# Patient Record
Sex: Male | Born: 1965 | Race: White | Hispanic: No | Marital: Married | Smoking: Never smoker
Health system: Southern US, Community
[De-identification: ages and names within clinical notes are randomized; demographics above are authoritative.]

## PROBLEM LIST (undated history)

## (undated) DIAGNOSIS — M1990 Unspecified osteoarthritis, unspecified site: Secondary | ICD-10-CM

## (undated) DIAGNOSIS — T7840XA Allergy, unspecified, initial encounter: Secondary | ICD-10-CM

## (undated) DIAGNOSIS — G4730 Sleep apnea, unspecified: Secondary | ICD-10-CM

## (undated) DIAGNOSIS — K5190 Ulcerative colitis, unspecified, without complications: Secondary | ICD-10-CM

## (undated) DIAGNOSIS — J45909 Unspecified asthma, uncomplicated: Secondary | ICD-10-CM

## (undated) HISTORY — PX: NASAL SEPTOPLASTY W/ TURBINOPLASTY: SHX2070

## (undated) HISTORY — DX: Ulcerative colitis, unspecified, without complications: K51.90

## (undated) HISTORY — DX: Allergy, unspecified, initial encounter: T78.40XA

## (undated) HISTORY — DX: Unspecified asthma, uncomplicated: J45.909

## (undated) HISTORY — DX: Unspecified osteoarthritis, unspecified site: M19.90

## (undated) HISTORY — DX: Sleep apnea, unspecified: G47.30

---

## 2003-09-24 ENCOUNTER — Emergency Department (HOSPITAL_COMMUNITY): Admission: EM | Admit: 2003-09-24 | Discharge: 2003-09-24 | Payer: Self-pay | Admitting: Emergency Medicine

## 2005-05-12 ENCOUNTER — Ambulatory Visit (HOSPITAL_COMMUNITY): Admission: RE | Admit: 2005-05-12 | Discharge: 2005-05-12 | Payer: Self-pay | Admitting: Family Medicine

## 2007-01-17 DIAGNOSIS — K5190 Ulcerative colitis, unspecified, without complications: Secondary | ICD-10-CM

## 2007-01-17 HISTORY — DX: Ulcerative colitis, unspecified, without complications: K51.90

## 2009-01-21 ENCOUNTER — Encounter: Admission: RE | Admit: 2009-01-21 | Discharge: 2009-01-21 | Payer: Self-pay | Admitting: Occupational Medicine

## 2010-02-06 ENCOUNTER — Encounter: Payer: Self-pay | Admitting: Family Medicine

## 2013-04-08 ENCOUNTER — Ambulatory Visit (INDEPENDENT_AMBULATORY_CARE_PROVIDER_SITE_OTHER): Payer: 59

## 2013-04-08 ENCOUNTER — Other Ambulatory Visit: Payer: Self-pay

## 2013-04-08 VITALS — BP 133/84 | HR 76 | Resp 16 | Ht 70.0 in | Wt 185.0 lb

## 2013-04-08 DIAGNOSIS — M79609 Pain in unspecified limb: Secondary | ICD-10-CM

## 2013-04-08 DIAGNOSIS — M79673 Pain in unspecified foot: Secondary | ICD-10-CM

## 2013-04-08 DIAGNOSIS — M2020 Hallux rigidus, unspecified foot: Secondary | ICD-10-CM

## 2013-04-08 DIAGNOSIS — M779 Enthesopathy, unspecified: Secondary | ICD-10-CM

## 2013-04-08 DIAGNOSIS — M722 Plantar fascial fibromatosis: Secondary | ICD-10-CM

## 2013-04-08 MED ORDER — MELOXICAM 15 MG PO TABS: 15.0000 mg | ORAL_TABLET | Freq: Every day | ORAL | Status: DC

## 2013-04-08 NOTE — Patient Instructions (Addendum)
ICE INSTRUCTIONS  Apply ice or cold pack to the affected area at least 3 times a day for 10-15 minutes each time.  You should also use ice after prolonged activity or vigorous exercise.  Do not apply ice longer than 20 minutes at one time.  Always keep a cloth between your skin and the ice pack to prevent burns.  Being consistent and following these instructions will help control your symptoms.  We suggest you purchase a gel ice pack because they are reusable and do bit leak.  Some of them are designed to wrap around the area.  Use the method that works best for you.  Here are some other suggestions for icing.   Use a frozen bag of peas or corn-inexpensive and molds well to your body, usually stays frozen for 10 to 20 minutes.  Wet a towel with cold water and squeeze out the excess until it's damp.  Place in a bag in the freezer for 20 minutes. Then remove and use.   Recommend no going barefoot no flimsy shoes or flip-flops suggest crocs for around the house or something like a Birkenstock sandal Also maintain MOBIC or meloxicam 15 mg once daily as prescribed discontinue if any GI upset or irritation from the medication.
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
## 2013-05-02 ENCOUNTER — Ambulatory Visit (INDEPENDENT_AMBULATORY_CARE_PROVIDER_SITE_OTHER): Payer: 59

## 2013-05-02 VITALS — BP 114/84 | HR 62 | Resp 18

## 2013-05-02 DIAGNOSIS — M722 Plantar fascial fibromatosis: Secondary | ICD-10-CM

## 2013-05-02 DIAGNOSIS — M779 Enthesopathy, unspecified: Secondary | ICD-10-CM

## 2013-05-02 DIAGNOSIS — M79609 Pain in unspecified limb: Secondary | ICD-10-CM

## 2013-05-02 DIAGNOSIS — M2020 Hallux rigidus, unspecified foot: Secondary | ICD-10-CM

## 2013-05-02 DIAGNOSIS — M79673 Pain in unspecified foot: Secondary | ICD-10-CM

## 2013-05-02 NOTE — Patient Instructions (Signed)

## 2013-05-02 NOTE — Progress Notes (Signed)
Subjective:     Patient ID: Jason Brock, male   DOB: [birth year], 48 y.o.   MRN: [id number]  HPI I am doing a little better and the taping helped a lot on both my heels   Review of Systems no systemic changes or findings noted     Objective:   Physical Exam her objective findings unchanged pedal pulses palpable DP and PT posterior were for Refill time 3 seconds all digits skin temperature warm turgor normal no edema rubor pallor or varicosities noted. Patient these have some pain plantar fascia however also significant sinus tarsitis capsulitis more so than right left foot with walking running activities patient indicates he strikes and walks on the outside of his foot does have forefoot varus and has mild arthritic changes is noted clinically and radiographically as such she can using a neutral shoe would benefit more from a stability shoe as a functional orthoses. Again neurovascular status intact open wounds ulcerations or other complications noted to have improvement while fascial strapping was in place.     Assessment:     Plantar fasciitis/heel spur syndrome as well as capsulitis the sinus tarsi her right foot more so than left respond to fascial strapping should response of functional orthoses as well this time.    Plan:     Orthotics skin carried at this time for new functional orthoses patient be contacted with the next 3 weeks for followup and orthotic fitting with a red and are ready for fitting patient advised to contact us in changes or difficulties in the interim. Maintain ice maintain a stable shoe may be shoe changes in the future however at the current time maintain either his nerve neutral shoe or switch to a stability shoe in the future  [name] DPM

## 2013-06-06 ENCOUNTER — Ambulatory Visit (INDEPENDENT_AMBULATORY_CARE_PROVIDER_SITE_OTHER): Payer: 59 | Admitting: *Deleted

## 2013-06-06 DIAGNOSIS — M722 Plantar fascial fibromatosis: Secondary | ICD-10-CM

## 2013-06-06 NOTE — Progress Notes (Signed)
Dispensed patient's orthotics with oral and written instructions for wearing. Patient will follow up with [facility] in 6 weeks for an orthotic check.

## 2013-06-06 NOTE — Patient Instructions (Signed)

## 2014-12-22 ENCOUNTER — Ambulatory Visit (INDEPENDENT_AMBULATORY_CARE_PROVIDER_SITE_OTHER): Payer: 59 | Admitting: Family Medicine

## 2014-12-22 ENCOUNTER — Encounter: Payer: Self-pay | Admitting: Family Medicine

## 2014-12-22 VITALS — BP 130/80 | HR 95 | Ht 70.0 in | Wt 204.0 lb

## 2014-12-22 DIAGNOSIS — Z0000 Encounter for general adult medical examination without abnormal findings: Secondary | ICD-10-CM

## 2014-12-22 DIAGNOSIS — G4730 Sleep apnea, unspecified: Secondary | ICD-10-CM | POA: Diagnosis not present

## 2014-12-22 DIAGNOSIS — Z113 Encounter for screening for infections with a predominantly sexual mode of transmission: Secondary | ICD-10-CM | POA: Insufficient documentation

## 2014-12-22 DIAGNOSIS — Z114 Encounter for screening for human immunodeficiency virus [HIV]: Secondary | ICD-10-CM

## 2014-12-22 DIAGNOSIS — R1310 Dysphagia, unspecified: Secondary | ICD-10-CM

## 2014-12-22 DIAGNOSIS — J45909 Unspecified asthma, uncomplicated: Secondary | ICD-10-CM

## 2014-12-22 DIAGNOSIS — E559 Vitamin D deficiency, unspecified: Secondary | ICD-10-CM

## 2014-12-22 DIAGNOSIS — E785 Hyperlipidemia, unspecified: Secondary | ICD-10-CM

## 2014-12-22 DIAGNOSIS — IMO0001 Reserved for inherently not codable concepts without codable children: Secondary | ICD-10-CM | POA: Insufficient documentation

## 2014-12-22 NOTE — Patient Instructions (Addendum)
Thank you for coming in today. You were seen today to establish care and for problems swallowing and with sleep apnea. For the swallowing, I recommend you get a swallowing study. We also recommend you see your GI doctor for this swallowing issue as it could be due to Crohns disease which would warrant another endoscopy.  We will get some routine screening labs to check for any abnormalities and give you a call if you need to follow up. Please go to the lab fasting at your earliest convenience.  We will order a home sleep study, you will be contacted by the company. Please call in 1 week if you have not heard from [place].  Follow up with [place] as needed.

## 2014-12-22 NOTE — Assessment & Plan Note (Signed)
Will order lipid panel, HbA1c, Vit D, TSH, CMP and CBC.

## 2014-12-22 NOTE — Assessment & Plan Note (Signed)
Most likely due to pharyngoesophageal phase dysfunction given sensation in posterior OP and issue with dry foods, need for lubrication to initiate swallow. Will schedule modified barium swallow. Given possible Crohn's history, follow up with GI is warranted for evaluation for EGD.

## 2014-12-22 NOTE — Assessment & Plan Note (Signed)
Most likely given positive SOP-BANG sleep apnea questionnaire. Will order home sleep study. FOllow up after.

## 2014-12-22 NOTE — Progress Notes (Signed)
Jason Brock is a 49 y.o. male who presents to [facility] today for establish care, dysphagia, STD screening, and OSA screening.  Patient cites 1-2 yr history of difficulty swallowing, most notable for the first few bites of a meal necessitating increased liquid intake. He notes this happens more often with dry foods. He feels like the food is getting stuck in the "back of my throat" and happens 1-2 times weekly. He notes past EGD in 2009 that was benign. Patient does have a history of presumed Crohn's, which was adjusted to undetermined colitis when he improved on a low dose of mesalamine. Last colonoscopy in 2009 which next one due at age 36. Patient has felt well recently and will take mesalamine for "flare ups" of abdominal pain and diarrhea, which resolves in a day or so.   Patient also notes separating from his wife earlier this year. He notes he had sexual relations with multiple partners, but did not want to elaborate further. He has remained asymptomatic to burning, itching, discharge, lesions, dysuria, but has been anxious about this and would like to be screened.   Patient would like to be screened for OSA. Notes snoring, daytime somnolence, denies morning HA.   Patient is a  Higher education careers adviser in [employer]    Past Medical History  Diagnosis Date  . Asthma   . Allergy    History reviewed. No pertinent past surgical history. Social History  Substance Use Topics  . Smoking status: Never Smoker   . Smokeless tobacco: Former Systems developer    Types: Snuff    Quit date: 12/21/2004  . Alcohol Use: 0.0 - 6.0 oz/week    0-10 Cans of beer per week   family history includes Cancer in his mother; Depression in his brother; Heart disease in his father.  ROS as above Medications: Current Outpatient Prescriptions  Medication Sig Dispense Refill  . Budesonide (RHINOCORT ALLERGY NA) Place into the nose.    Marland Kitchen CLOBETASOL & CLOBETASOL EMUL EX Apply topically.    .  Fluticasone-Salmeterol (ADVAIR DISKUS IN) Inhale into the lungs.    Marland Kitchen LEVOCETIRIZINE DIHYDROCHLORIDE PO Take by mouth.    . Mesalamine (LIALDA PO) Take by mouth.     No current facility-administered medications for this visit.   No Known Allergies   Exam:  BP 130/80 mmHg  Pulse 95  Ht 5' 10"  (1.778 m)  Wt 204 lb (92.534 kg)  BMI 29.27 kg/m2 Gen: Well appearing gentleman seated in on exam table in NAD HEENT: EOMI,  MMM, TM intact, gray, translucent, normal position  Lungs: Normal work of breathing. CTABL no wheezes crackles  Heart: RRR no MRG Abd: NABS, Soft. Nondistended, Nontender Exts: Brisk capillary refill, warm and well perfused.    No results found for this or any previous visit (from the past 24 hour(s)). No results found.  STOP BANG sleep apnea questionnaire: 5, meeting "high risk for OSA" criteria   Please see individual assessment and plan sections.

## 2014-12-22 NOTE — Assessment & Plan Note (Signed)
Patient wants to be screened for STI. Will order HIV, HCV, GC/Chlamydia.

## 2014-12-22 NOTE — Assessment & Plan Note (Signed)
Chronic, stable. Follows with pulmonology.

## 2014-12-23 LAB — COMPREHENSIVE METABOLIC PANEL
ALT: 22 U/L (ref 9–46)
AST: 21 U/L (ref 10–40)
Albumin: 3.8 g/dL (ref 3.6–5.1)
Alkaline Phosphatase: 49 U/L (ref 40–115)
BUN: 17 mg/dL (ref 7–25)
CO2: 24 mmol/L (ref 20–31)
Calcium: 8.8 mg/dL (ref 8.6–10.3)
Chloride: 102 mmol/L (ref 98–110)
Creat: 0.85 mg/dL (ref 0.60–1.35)
Glucose, Bld: 95 mg/dL (ref 65–99)
Potassium: 4.3 mmol/L (ref 3.5–5.3)
Sodium: 138 mmol/L (ref 135–146)
Total Bilirubin: 0.5 mg/dL (ref 0.2–1.2)
Total Protein: 6.6 g/dL (ref 6.1–8.1)

## 2014-12-23 LAB — CBC
HCT: 42.9 % (ref 39.0–52.0)
Hemoglobin: 15 g/dL (ref 13.0–17.0)
MCH: 29.1 pg (ref 26.0–34.0)
MCHC: 35 g/dL (ref 30.0–36.0)
MCV: 83.3 fL (ref 78.0–100.0)
MPV: 10.6 fL (ref 8.6–12.4)
Platelets: 250 10*3/uL (ref 150–400)
RBC: 5.15 MIL/uL (ref 4.22–5.81)
RDW: 13.6 % (ref 11.5–15.5)
WBC: 8.2 10*3/uL (ref 4.0–10.5)

## 2014-12-23 LAB — LIPID PANEL
Cholesterol: 197 mg/dL (ref 125–200)
HDL: 29 mg/dL — ABNORMAL LOW (ref >=40)
LDL Cholesterol: 91 mg/dL (ref <130)
Total CHOL/HDL Ratio: 6.8 Ratio — ABNORMAL HIGH (ref <=5.0)
Triglycerides: 384 mg/dL — ABNORMAL HIGH (ref <150)
VLDL: 77 mg/dL — ABNORMAL HIGH (ref <30)

## 2014-12-23 LAB — TSH: TSH: 3.659 u[IU]/mL (ref 0.350–4.500)

## 2014-12-23 LAB — GC/CHLAMYDIA PROBE AMP
CT Probe RNA: NOT DETECTED
GC Probe RNA: NOT DETECTED

## 2014-12-23 NOTE — Progress Notes (Signed)
Quick Note:  Chlamydia and gonorrhea test are negative. Other tests are still pending. ______

## 2014-12-24 DIAGNOSIS — E785 Hyperlipidemia, unspecified: Secondary | ICD-10-CM | POA: Insufficient documentation

## 2014-12-24 DIAGNOSIS — E559 Vitamin D deficiency, unspecified: Secondary | ICD-10-CM | POA: Insufficient documentation

## 2014-12-24 LAB — VITAMIN D 25 HYDROXY (VIT D DEFICIENCY, FRACTURES): Vit D, 25-Hydroxy: 26 ng/mL — ABNORMAL LOW (ref 30–100)

## 2014-12-24 LAB — RPR

## 2014-12-24 LAB — HIV ANTIBODY (ROUTINE TESTING W REFLEX): HIV 1&2 Ab, 4th Generation: NONREACTIVE

## 2014-12-24 MED ORDER — ATORVASTATIN CALCIUM 10 MG PO TABS: 10.0000 mg | ORAL_TABLET | Freq: Every day | ORAL | Status: DC

## 2014-12-24 MED ORDER — VITAMIN D (ERGOCALCIFEROL) 1.25 MG (50000 UNIT) PO CAPS: 50000.0000 [IU] | ORAL_CAPSULE | ORAL | Status: DC

## 2014-12-24 NOTE — Addendum Note (Signed)
Addended by: Gregor Hams on: 12/24/2014 08:13 AM   Modules accepted: Orders

## 2014-12-24 NOTE — Progress Notes (Signed)
Quick Note:  1) Cholesterol is bad. Start lipitor and over the counter omega 3 2 pills 2x daily.  2) Vit D is low. Start prescription Vit D.  Recheck labs in 1-2 months. Come fasting. ______

## 2015-01-14 ENCOUNTER — Telehealth: Payer: Self-pay | Admitting: Family Medicine

## 2015-01-14 NOTE — Telephone Encounter (Signed)
Pt called to get results from his sleep study (preformed 01/05/15). Do not see results scanned into chart, will route to PCP for review and recommendation if results have been received.

## 2015-01-14 NOTE — Telephone Encounter (Signed)
Sleep study has not been scanned yet. It shows sleep apnea. We are in the process of ordering a CPAP machine.

## 2015-01-15 ENCOUNTER — Encounter: Payer: Self-pay | Admitting: Family Medicine

## 2015-01-15 MED ORDER — AMBULATORY NON FORMULARY MEDICATION: Status: AC

## 2015-01-15 NOTE — Telephone Encounter (Signed)
Faxed CPAP order to Darnelle Bos with [employer] at [phone].

## 2015-01-15 NOTE — Telephone Encounter (Signed)
Pt advised of positive sleep apnea study results and that CPAP was being ordered. Expecting a phone call from the company.

## 2015-01-19 ENCOUNTER — Telehealth: Payer: Self-pay

## 2015-01-19 NOTE — Telephone Encounter (Signed)
Patient called requesting follow up on sleep study order.  Order not done.  Please place order with directions.

## 2015-01-20 NOTE — Telephone Encounter (Signed)
Can you check on the CPAP order? I think we sent the rx to [facility]

## 2015-01-20 NOTE — Telephone Encounter (Signed)
Called [facility] to get an update on the status of the order. They stated they did not receive it. Refaxed order to [phone] per the request of the [employer] representative.

## 2015-02-21 ENCOUNTER — Other Ambulatory Visit: Payer: Self-pay | Admitting: Family Medicine

## 2015-03-09 ENCOUNTER — Ambulatory Visit (INDEPENDENT_AMBULATORY_CARE_PROVIDER_SITE_OTHER): Payer: 59 | Admitting: Family Medicine

## 2015-03-09 ENCOUNTER — Encounter: Payer: Self-pay | Admitting: Family Medicine

## 2015-03-09 VITALS — BP 127/87 | HR 67 | Wt 207.0 lb

## 2015-03-09 DIAGNOSIS — J45909 Unspecified asthma, uncomplicated: Secondary | ICD-10-CM

## 2015-03-09 DIAGNOSIS — G4730 Sleep apnea, unspecified: Secondary | ICD-10-CM

## 2015-03-09 DIAGNOSIS — E559 Vitamin D deficiency, unspecified: Secondary | ICD-10-CM | POA: Diagnosis not present

## 2015-03-09 DIAGNOSIS — E785 Hyperlipidemia, unspecified: Secondary | ICD-10-CM

## 2015-03-09 MED ORDER — IPRATROPIUM BROMIDE 0.06 % NA SOLN: 2.0000 | NASAL | Status: DC | PRN

## 2015-03-09 MED ORDER — ALBUTEROL SULFATE 108 (90 BASE) MCG/ACT IN AEPB: 1.0000 | INHALATION_SPRAY | RESPIRATORY_TRACT | Status: DC | PRN

## 2015-03-09 MED ORDER — FLUTICASONE FUROATE-VILANTEROL 100-25 MCG/INH IN AEPB: 1.0000 | INHALATION_SPRAY | Freq: Every day | RESPIRATORY_TRACT | Status: DC

## 2015-03-09 NOTE — Progress Notes (Signed)
       Jason Brock is a 50 y.o. male who presents to [facility] today for follow-up lipids, vitamin D deficiency, and asthma.  1) hyperlipidemia. Patient was diagnosed with hyperlipidemia at the last visit. He was started on Lipitor. He's been tolerating this medicine for the last 2 months. He feels well with no muscle aches.  2) vitamin D deficiency noted at the last visit. Patient has completed his course of 50,000 units of vitamin D weekly. He feels well.  3) asthma: Currently reasonably well controlled with Advair. He often takes Advair once daily instead of twice daily for the convenience factor. He notes typically has asthma is quite well-controlled but notes over the last several weeks it has worsened a bit. He said he uses albuterol inhaler a few more times recently. He attributes this to the spring allergy season which is typical for him. He denies significant wheezing or shortness of breath however he does note nasal discharge and congestion.  4) sleep apnea: Well controlled with CPAP. He feels much better with this device.   Past Medical History  Diagnosis Date  . Asthma   . Allergy    No past surgical history on file. Social History  Substance Use Topics  . Smoking status: Never Smoker   . Smokeless tobacco: Former Systems developer    Types: Snuff    Quit date: 12/21/2004  . Alcohol Use: 0.0 - 6.0 oz/week    0-10 Cans of beer per week   family history includes Cancer in his mother; Depression in his brother; Heart disease in his father.  ROS as above Medications: Current Outpatient Prescriptions  Medication Sig Dispense Refill  . AMBULATORY NON FORMULARY MEDICATION Continuous positive airway pressure (CPAP) titrate setting per protcal of H2O pressure, with all supplemental supplies as needed. 1 each 0  . atorvastatin (LIPITOR) 10 MG tablet TAKE 1 TABLET (10 MG TOTAL) BY MOUTH  DAILY. 90 tablet 1  . Budesonide (RHINOCORT ALLERGY NA) Place into the nose.    Marland Kitchen CLOBETASOL & CLOBETASOL EMUL EX Apply topically.    Marland Kitchen levocetirizine (XYZAL) 5 MG tablet     . LEVOCETIRIZINE DIHYDROCHLORIDE PO Take by mouth.    . Mesalamine (LIALDA PO) Take by mouth.    . Multiple Vitamins-Minerals (MULTIVITAMIN ADULT PO) Take by mouth.    . Albuterol Sulfate (PROAIR RESPICLICK) 884 (90 Base) MCG/ACT AEPB Inhale 1-2 puffs into the lungs every 4 (four) hours as needed (wheeze). 1 each 2  . fluticasone furoate-vilanterol (BREO ELLIPTA) 100-25 MCG/INH AEPB Inhale 1 puff into the lungs daily. 1 each 11   No current facility-administered medications for this visit.   No Known Allergies   Exam:  BP 127/87 mmHg  Pulse 67  Wt 207 lb (93.895 kg) Gen: Well NAD HEENT: EOMI,  MMM clear nasal discharge. Posterior pharynx cobblestoning. Normal tympanic membranes bilaterally. Lungs: Normal work of breathing. CTABL Heart: RRR no MRG Abd: NABS, Soft. Nondistended, Nontender Exts: Brisk capillary refill, warm and well perfused.   No results found for this or any previous visit (from the past 24 hour(s)). No results found.   Please see individual assessment and plan sections.

## 2015-03-09 NOTE — Assessment & Plan Note (Signed)
Check vitamin D level 

## 2015-03-09 NOTE — Assessment & Plan Note (Signed)
Switch to [facility] for once daily dosing. Refill albuterol inhaler. Recheck in 6 months or so.

## 2015-03-09 NOTE — Assessment & Plan Note (Signed)
Check lipid CBC and CMP. Continue Lipitor

## 2015-03-09 NOTE — Patient Instructions (Signed)
Thank you for coming in today. Return in 6-12 months.  Use breo and proair Call or go to the emergency room if you get worse, have trouble breathing, have chest pains, or palpitations.

## 2015-03-09 NOTE — Assessment & Plan Note (Signed)
Doing well with C Pap

## 2015-03-10 LAB — COMPREHENSIVE METABOLIC PANEL
ALT: 123 U/L — ABNORMAL HIGH (ref 9–46)
AST: 59 U/L — ABNORMAL HIGH (ref 10–40)
Albumin: 4 g/dL (ref 3.6–5.1)
Alkaline Phosphatase: 56 U/L (ref 40–115)
BUN: 13 mg/dL (ref 7–25)
CO2: 29 mmol/L (ref 20–31)
Calcium: 9.7 mg/dL (ref 8.6–10.3)
Chloride: 101 mmol/L (ref 98–110)
Creat: 0.94 mg/dL (ref 0.60–1.35)
Glucose, Bld: 89 mg/dL (ref 65–99)
Potassium: 4.4 mmol/L (ref 3.5–5.3)
Sodium: 139 mmol/L (ref 135–146)
Total Bilirubin: 0.7 mg/dL (ref 0.2–1.2)
Total Protein: 6.8 g/dL (ref 6.1–8.1)

## 2015-03-10 LAB — CBC
HCT: 44 % (ref 39.0–52.0)
Hemoglobin: 14.9 g/dL (ref 13.0–17.0)
MCH: 28.7 pg (ref 26.0–34.0)
MCHC: 33.9 g/dL (ref 30.0–36.0)
MCV: 84.6 fL (ref 78.0–100.0)
MPV: 10.9 fL (ref 8.6–12.4)
Platelets: 284 10*3/uL (ref 150–400)
RBC: 5.2 MIL/uL (ref 4.22–5.81)
RDW: 14.1 % (ref 11.5–15.5)
WBC: 9.2 10*3/uL (ref 4.0–10.5)

## 2015-03-10 LAB — LIPID PANEL
Cholesterol: 150 mg/dL (ref 125–200)
HDL: 32 mg/dL — ABNORMAL LOW (ref >=40)
LDL Cholesterol: 67 mg/dL (ref <130)
Total CHOL/HDL Ratio: 4.7 Ratio (ref <=5.0)
Triglycerides: 255 mg/dL — ABNORMAL HIGH (ref <150)
VLDL: 51 mg/dL — ABNORMAL HIGH (ref <30)

## 2015-03-10 LAB — VITAMIN D 25 HYDROXY (VIT D DEFICIENCY, FRACTURES): Vit D, 25-Hydroxy: 29 ng/mL — ABNORMAL LOW (ref 30–100)

## 2015-03-11 ENCOUNTER — Telehealth: Payer: Self-pay | Admitting: Family Medicine

## 2015-03-11 NOTE — Progress Notes (Signed)
Quick Note:  Labs are improving except for liver labs which show mild irritation (not bad). We will recheck them in 1 month. ______

## 2015-03-12 MED ORDER — LEVOCETIRIZINE DIHYDROCHLORIDE 5 MG PO TABS: 5.0000 mg | ORAL_TABLET | Freq: Every evening | ORAL | Status: DC

## 2015-03-12 NOTE — Telephone Encounter (Signed)
Done

## 2015-04-08 ENCOUNTER — Emergency Department
Admission: EM | Admit: 2015-04-08 | Discharge: 2015-04-08 | Disposition: A | Discharge status: Home / Self Care | Payer: 59 | Source: Home / Self Care | Attending: Family Medicine | Admitting: Family Medicine

## 2015-04-08 ENCOUNTER — Encounter: Payer: Self-pay | Admitting: Emergency Medicine

## 2015-04-08 DIAGNOSIS — J111 Influenza due to unidentified influenza virus with other respiratory manifestations: Secondary | ICD-10-CM | POA: Diagnosis not present

## 2015-04-08 DIAGNOSIS — R69 Illness, unspecified: Principal | ICD-10-CM

## 2015-04-08 MED ORDER — PREDNISONE 50 MG PO TABS: ORAL_TABLET | ORAL | Status: DC

## 2015-04-08 MED ORDER — OSELTAMIVIR PHOSPHATE 75 MG PO CAPS: 75.0000 mg | ORAL_CAPSULE | Freq: Two times a day (BID) | ORAL | Status: DC

## 2015-04-08 MED ORDER — BENZONATATE 200 MG PO CAPS: 200.0000 mg | ORAL_CAPSULE | Freq: Every day | ORAL | Status: DC

## 2015-04-08 NOTE — ED Notes (Signed)
Fever 102, runny nose, body aches, cough x 2 days

## 2015-04-08 NOTE — ED Provider Notes (Signed)
CSN: [id number]     Arrival date & time 04/08/15  1478 History   First MD Initiated Contact with Patient 04/08/15 [phone]     Chief Complaint  Patient presents with  . Influenza      HPI Comments: Complains of 2 day history flu-like illness including myalgias, fever/chills, fatigue, and cough.  Also has mild nasal congestion and sore throat.  Cough is non-productive and somewhat worse at night.  No pleuritic pain or shortness of breath.    The history is provided by the patient.    Past Medical History  Diagnosis Date  . Asthma   . Allergy    History reviewed. No pertinent past surgical history. Family History  Problem Relation Age of Onset  . Cancer Mother   . Heart disease Father   . Depression Brother    Social History  Substance Use Topics  . Smoking status: Never Smoker   . Smokeless tobacco: Former Systems developer    Types: Snuff    Quit date: 12/21/2004  . Alcohol Use: 0.0 - 6.0 oz/week    0-10 Cans of beer per week    Review of Systems + sore throat + cough + sneezing No pleuritic pain No wheezing + nasal congestion + post-nasal drainage No sinus pain/pressure No itchy/red eyes No earache No hemoptysis No SOB + fever, + chills No nausea No vomiting No abdominal pain No diarrhea No urinary symptoms No skin rash + fatigue + myalgias No headache Used OTC meds without relief  Allergies  Review of patient's allergies indicates no known allergies.  Home Medications   Prior to Admission medications   Medication Sig Start Date End Date Taking? Authorizing Provider  Albuterol Sulfate (PROAIR RESPICLICK) 213 (90 Base) MCG/ACT AEPB Inhale 1-2 puffs into the lungs every 4 (four) hours as needed (wheeze). 03/09/15   Gregor Hams, MD  AMBULATORY NON FORMULARY MEDICATION Continuous positive airway pressure (CPAP) titrate setting per protcal of H2O pressure, with all supplemental supplies as needed. 01/15/15   Gregor Hams, MD  atorvastatin (LIPITOR) 10 MG tablet TAKE 1  TABLET (10 MG TOTAL) BY MOUTH DAILY. 02/22/15   Gregor Hams, MD  benzonatate (TESSALON) 200 MG capsule Take 1 capsule (200 mg total) by mouth at bedtime. Take as needed for cough 04/08/15   Kandra Nicolas, MD  Budesonide (RHINOCORT ALLERGY NA) Place into the nose.    Historical Provider, MD  CLOBETASOL & CLOBETASOL EMUL EX Apply topically.    Historical Provider, MD  fluticasone furoate-vilanterol (BREO ELLIPTA) 100-25 MCG/INH AEPB Inhale 1 puff into the lungs daily. 03/09/15   Gregor Hams, MD  ipratropium (ATROVENT) 0.06 % nasal spray Place 2 sprays into both nostrils every 4 (four) hours as needed for rhinitis. 03/09/15   Gregor Hams, MD  levocetirizine (XYZAL) 5 MG tablet Take 1 tablet (5 mg total) by mouth every evening. 03/12/15   Gregor Hams, MD  LEVOCETIRIZINE DIHYDROCHLORIDE PO Take by mouth.    Historical Provider, MD  Mesalamine (LIALDA PO) Take by mouth.    Historical Provider, MD  Multiple Vitamins-Minerals (MULTIVITAMIN ADULT PO) Take by mouth.    Historical Provider, MD  oseltamivir (TAMIFLU) 75 MG capsule Take 1 capsule (75 mg total) by mouth every 12 (twelve) hours. 04/08/15   Kandra Nicolas, MD  predniSONE (DELTASONE) 50 MG tablet Take one tab by mouth with food once daily for five days 04/08/15   Kandra Nicolas, MD   Meds Ordered and Administered this  Visit  Medications - No data to display  BP 133/88 mmHg  Pulse 98  Temp(Src) 98.7 F (37.1 C) (Oral)  Ht 5' 10"  (1.778 m)  Wt 212 lb (96.163 kg)  BMI 30.42 kg/m2  SpO2 96% No data found.   Physical Exam Nursing notes and Vital Signs reviewed. Appearance:  Patient appears stated age, and in no acute distress.  Patient is obese (BMI 30.4) Eyes:  Pupils are equal, round, and reactive to light and accomodation.  Extraocular movement is intact.  Conjunctivae are not inflamed  Ears:  Canals normal.  Tympanic membranes normal.  Nose:  Mildly congested turbinates.  No sinus tenderness.   Pharynx:  Normal Neck:  Supple.  Tender  enlarged posterior/lateral nodes are palpated bilaterally  Lungs:  Clear to auscultation.  Breath sounds are equal.  Moving air well. Heart:  Regular rate and rhythm without murmurs, rubs, or gallops.  Abdomen:  Nontender without masses or hepatosplenomegaly.  Bowel sounds are present.  No CVA or flank tenderness.  Extremities:  No edema.  Skin:  No rash present.   ED Course  Procedures none  MDM   1. Influenza-like illness    Begin Tamiflu, and prednisone burst.  Prescription written for Benzonatate (Tessalon) to take at bedtime for night-time cough.  Take plain guaifenesin (1264m extended release tabs such as Mucinex) twice daily, with plenty of water, for cough and congestion.  May add Pseudoephedrine (375m one or two every 4 to 6 hours) for sinus congestion.  Get adequate rest.   May use Afrin nasal spray (or generic oxymetazoline) twice daily for about 5 days and then discontinue.  Also recommend using saline nasal spray several times daily and saline nasal irrigation (AYR is a common brand).  Use Flonase nasal spray each morning after using Afrin nasal spray and saline nasal irrigation. Try warm salt water gargles for sore throat.  Stop all antihistamines for now, and other non-prescription cough/cold preparations.   Follow-up with family doctor if not improving about one week    StKandra NicolasMD 04/15/15 [phone]

## 2015-04-08 NOTE — Discharge Instructions (Signed)
Take plain guaifenesin (1232m extended release tabs such as Mucinex) twice daily, with plenty of water, for cough and congestion.  May add Pseudoephedrine (373m one or two every 4 to 6 hours) for sinus congestion.  Get adequate rest.   May use Afrin nasal spray (or generic oxymetazoline) twice daily for about 5 days and then discontinue.  Also recommend using saline nasal spray several times daily and saline nasal irrigation (AYR is a common brand).  Use Flonase nasal spray each morning after using Afrin nasal spray and saline nasal irrigation. Try warm salt water gargles for sore throat.  Stop all antihistamines for now, and other non-prescription cough/cold preparations.   Follow-up with family doctor if not improving about one week

## 2015-04-29 ENCOUNTER — Ambulatory Visit (INDEPENDENT_AMBULATORY_CARE_PROVIDER_SITE_OTHER): Payer: 59 | Admitting: Family Medicine

## 2015-04-29 ENCOUNTER — Encounter: Payer: Self-pay | Admitting: Family Medicine

## 2015-04-29 VITALS — BP 123/83 | HR 71 | Wt 205.0 lb

## 2015-04-29 DIAGNOSIS — E785 Hyperlipidemia, unspecified: Secondary | ICD-10-CM

## 2015-04-29 LAB — CBC
HCT: 43.8 % (ref 38.5–50.0)
Hemoglobin: 14.8 g/dL (ref 13.2–17.1)
MCH: 28.5 pg (ref 27.0–33.0)
MCHC: 33.8 g/dL (ref 32.0–36.0)
MCV: 84.4 fL (ref 80.0–100.0)
MPV: 10.2 fL (ref 7.5–12.5)
Platelets: 308 10*3/uL (ref 140–400)
RBC: 5.19 MIL/uL (ref 4.20–5.80)
RDW: 13.5 % (ref 11.0–15.0)
WBC: 6.5 10*3/uL (ref 3.8–10.8)

## 2015-04-29 LAB — LIPID PANEL
Cholesterol: 132 mg/dL (ref 125–200)
HDL: 29 mg/dL — ABNORMAL LOW (ref >=40)
LDL Cholesterol: 60 mg/dL (ref <130)
Total CHOL/HDL Ratio: 4.6 Ratio (ref <=5.0)
Triglycerides: 216 mg/dL — ABNORMAL HIGH (ref <150)
VLDL: 43 mg/dL — ABNORMAL HIGH (ref <30)

## 2015-04-29 LAB — COMPREHENSIVE METABOLIC PANEL
ALT: 39 U/L (ref 9–46)
AST: 26 U/L (ref 10–40)
Albumin: 4.3 g/dL (ref 3.6–5.1)
Alkaline Phosphatase: 60 U/L (ref 40–115)
BUN: 21 mg/dL (ref 7–25)
CO2: 23 mmol/L (ref 20–31)
Calcium: 9.5 mg/dL (ref 8.6–10.3)
Chloride: 103 mmol/L (ref 98–110)
Creat: 1.03 mg/dL (ref 0.60–1.35)
Glucose, Bld: 101 mg/dL — ABNORMAL HIGH (ref 65–99)
Potassium: 4.9 mmol/L (ref 3.5–5.3)
Sodium: 139 mmol/L (ref 135–146)
Total Bilirubin: 0.6 mg/dL (ref 0.2–1.2)
Total Protein: 7.1 g/dL (ref 6.1–8.1)

## 2015-04-29 NOTE — Progress Notes (Signed)
       Jason Brock is a 50 y.o. male who presents to [facility] today for follow-up mild transaminitis. Patient was recently started on Lipitor for hyperlipidemia. On recheck he developed a mild transaminitis. He is completely asymptomatic and tolerating the medicines well. He is here today for recheck and reevaluation.   Past Medical History  Diagnosis Date  . Asthma   . Allergy    No past surgical history on file. Social History  Substance Use Topics  . Smoking status: Never Smoker   . Smokeless tobacco: Former Systems developer    Types: Snuff    Quit date: 12/21/2004  . Alcohol Use: 0.0 - 6.0 oz/week    0-10 Cans of beer per week   family history includes Cancer in his mother; Depression in his brother; Heart disease in his father.  ROS as above Medications: Current Outpatient Prescriptions  Medication Sig Dispense Refill  . Albuterol Sulfate (PROAIR RESPICLICK) 779 (90 Base) MCG/ACT AEPB Inhale 1-2 puffs into the lungs every 4 (four) hours as needed (wheeze). 1 each 2  . AMBULATORY NON FORMULARY MEDICATION Continuous positive airway pressure (CPAP) titrate setting per protcal of H2O pressure, with all supplemental supplies as needed. 1 each 0  . atorvastatin (LIPITOR) 10 MG tablet TAKE 1 TABLET (10 MG TOTAL) BY MOUTH DAILY. 90 tablet 1  . benzonatate (TESSALON) 200 MG capsule Take 1 capsule (200 mg total) by mouth at bedtime. Take as needed for cough 12 capsule 0  . Budesonide (RHINOCORT ALLERGY NA) Place into the nose.    Marland Kitchen CLOBETASOL & CLOBETASOL EMUL EX Apply topically.    . fluticasone furoate-vilanterol (BREO ELLIPTA) 100-25 MCG/INH AEPB Inhale 1 puff into the lungs daily. 1 each 11  . ipratropium (ATROVENT) 0.06 % nasal spray Place 2 sprays into both nostrils every 4 (four) hours as needed for rhinitis. 10 mL 6  . levocetirizine (XYZAL) 5 MG tablet Take 1 tablet (5 mg total) by  mouth every evening. 90 tablet 2  . LEVOCETIRIZINE DIHYDROCHLORIDE PO Take by mouth.    . Mesalamine (LIALDA PO) Take by mouth.    . Multiple Vitamins-Minerals (MULTIVITAMIN ADULT PO) Take by mouth.     No current facility-administered medications for this visit.   No Known Allergies   Exam:  BP 123/83 mmHg  Pulse 71  Wt 205 lb (92.987 kg) Gen: Well NAD HEENT: EOMI,  MMM Lungs: Normal work of breathing. CTABL Heart: RRR no MRG Abd: NABS, Soft. Nondistended, Nontender Exts: Brisk capillary refill, warm and well perfused.   No results found for this or any previous visit (from the past 24 hour(s)). No results found.   Please see individual assessment and plan sections.

## 2015-04-29 NOTE — Patient Instructions (Signed)
Thank you for coming in today. Get labs.  We will call with results.

## 2015-04-29 NOTE — Assessment & Plan Note (Signed)
Check fasting lipid CBC and CMP.

## 2015-04-30 NOTE — Progress Notes (Signed)
Quick Note:  Liver enzymes are normal and cholesterol looks good. No changes needed. ______

## 2015-08-15 ENCOUNTER — Other Ambulatory Visit: Payer: Self-pay | Admitting: Family Medicine

## 2015-12-11 ENCOUNTER — Other Ambulatory Visit: Payer: Self-pay | Admitting: Family Medicine

## 2016-02-13 ENCOUNTER — Other Ambulatory Visit: Payer: Self-pay | Admitting: Family Medicine

## 2016-02-18 ENCOUNTER — Other Ambulatory Visit: Payer: Self-pay | Admitting: Family Medicine

## 2016-02-20 ENCOUNTER — Other Ambulatory Visit: Payer: Self-pay | Admitting: Family Medicine

## 2016-02-20 DIAGNOSIS — J45909 Unspecified asthma, uncomplicated: Secondary | ICD-10-CM

## 2016-03-02 ENCOUNTER — Other Ambulatory Visit: Payer: Self-pay | Admitting: Family Medicine

## 2016-03-02 DIAGNOSIS — J45909 Unspecified asthma, uncomplicated: Secondary | ICD-10-CM

## 2016-03-03 MED ORDER — FLUTICASONE FUROATE-VILANTEROL 100-25 MCG/INH IN AEPB: 1.0000 | INHALATION_SPRAY | Freq: Every day | RESPIRATORY_TRACT | 0 refills | Status: DC

## 2016-03-11 ENCOUNTER — Other Ambulatory Visit: Payer: Self-pay | Admitting: Family Medicine

## 2016-03-20 ENCOUNTER — Encounter: Payer: Self-pay | Admitting: Family Medicine

## 2016-03-20 ENCOUNTER — Ambulatory Visit (INDEPENDENT_AMBULATORY_CARE_PROVIDER_SITE_OTHER): Payer: Commercial Managed Care - HMO | Admitting: Family Medicine

## 2016-03-20 VITALS — BP 125/77 | HR 69 | Wt 217.0 lb

## 2016-03-20 DIAGNOSIS — E559 Vitamin D deficiency, unspecified: Secondary | ICD-10-CM | POA: Diagnosis not present

## 2016-03-20 DIAGNOSIS — K529 Noninfective gastroenteritis and colitis, unspecified: Secondary | ICD-10-CM

## 2016-03-20 DIAGNOSIS — G4730 Sleep apnea, unspecified: Secondary | ICD-10-CM | POA: Diagnosis not present

## 2016-03-20 DIAGNOSIS — Z23 Encounter for immunization: Secondary | ICD-10-CM | POA: Diagnosis not present

## 2016-03-20 DIAGNOSIS — J45909 Unspecified asthma, uncomplicated: Secondary | ICD-10-CM

## 2016-03-20 DIAGNOSIS — M6740 Ganglion, unspecified site: Secondary | ICD-10-CM | POA: Insufficient documentation

## 2016-03-20 DIAGNOSIS — E785 Hyperlipidemia, unspecified: Secondary | ICD-10-CM | POA: Diagnosis not present

## 2016-03-20 DIAGNOSIS — Z0000 Encounter for general adult medical examination without abnormal findings: Secondary | ICD-10-CM

## 2016-03-20 NOTE — Addendum Note (Signed)
Addended by: Darla Lesches T on: 03/20/2016 05:14 PM   Modules accepted: Orders

## 2016-03-20 NOTE — Progress Notes (Signed)
Jason Brock is a 51 y.o. male who presents to [facility] today for well adult. Patient is doing well with no active medical problems. He's gained some weight over the last year and notes that he like to try exercising again to lose weight. Use his CPAP every night for sleep apnea notes that it helps considerably. He feels well with no fevers chills nausea vomiting or diarrhea.   Past Medical History:  Diagnosis Date  . Allergy   . Asthma    No past surgical history on file. Social History  Substance Use Topics  . Smoking status: Never Smoker  . Smokeless tobacco: Former Systems developer    Types: Snuff    Quit date: 12/21/2004  . Alcohol use 0.0 - 6.0 oz/week   family history includes Cancer in his mother; Depression in his brother; Heart disease in his father.  ROS as above:  Medications: Current Outpatient Prescriptions  Medication Sig Dispense Refill  . Albuterol Sulfate (PROAIR RESPICLICK) 700 (90 Base) MCG/ACT AEPB Inhale 1-2 puffs into the lungs every 4 (four) hours as needed (wheeze). 1 each 2  . AMBULATORY NON FORMULARY MEDICATION Continuous positive airway pressure (CPAP) titrate setting per protcal of H2O pressure, with all supplemental supplies as needed. 1 each 0  . atorvastatin (LIPITOR) 10 MG tablet Take 1 tablet (10 mg total) by mouth daily at 6 PM. NEED FOLLOW UP APPOINTMENT FOR MORE REFILLS 30 tablet 0  . benzonatate (TESSALON) 200 MG capsule Take 1 capsule (200 mg total) by mouth at bedtime. Take as needed for cough 12 capsule 0  . Budesonide (RHINOCORT ALLERGY NA) Place into the nose.    Marland Kitchen CLOBETASOL & CLOBETASOL EMUL EX Apply topically.    . fluticasone furoate-vilanterol (BREO ELLIPTA) 100-25 MCG/INH AEPB Inhale 1 puff into the lungs daily. PATIENT MUST HAVE OV BEFORE ANY FURTHER REFILLS. 60 each 0  . levocetirizine (XYZAL) 5 MG tablet TAKE 1 TABLET  (5 MG TOTAL) BY MOUTH EVERY EVENING. 90 tablet 2  . Mesalamine (LIALDA PO) Take by mouth.    . Multiple Vitamins-Minerals (MULTIVITAMIN ADULT PO) Take by mouth.     No current facility-administered medications for this visit.    No Known Allergies  Health Maintenance Health Maintenance  Topic Date Due  . INFLUENZA VACCINE  08/17/2015  . TETANUS/TDAP  12/30/2016  . HIV Screening  Completed     Exam:  BP 125/77   Pulse 69   Wt 217 lb (98.4 kg)   BMI 31.14 kg/m  Gen: Well NAD HEENT: EOMI,  MMM Lungs: Normal work of breathing. CTABL Heart: RRR no MRG Abd: NABS, Soft. Nondistended, Nontender Exts: Brisk capillary refill, warm and well perfused.    No results found for this or any previous visit (from the past 72 hour(s)). No results found.    Assessment and Plan: 51 y.o. male with Well adult. Doing well. Plan to check basic fasting labs. Will refill medications when labs are normal. Tdap given today prior to discharge. Recheck in 1 year or sooner if needed. Recommend increasing activity for weight loss.   Orders Placed This Encounter  Procedures  . CBC  . COMPLETE METABOLIC PANEL WITH GFR  . Lipid Panel w/reflex Direct LDL  . VITAMIN D 25 Hydroxy (Vit-D Deficiency, Fractures)   No orders of the defined types were placed in this encounter.    Discussed warning signs or symptoms. Please see discharge instructions. Patient expresses understanding.

## 2016-03-20 NOTE — Patient Instructions (Addendum)
Thank you for coming in today. Recheck sooner if needed  Get fasting labs soon.  I will get the results to you ASAP.  Once labs are back I will send the refills to the pharmacy.   Otherwise if all is well we will recheck in about 1 year.   Starting at 50 year old we can consider colon cancer screening with a coloscopy.   Let me know if you want an order around your birthday.   Call or go to the emergency room if you get worse, have trouble breathing, have chest pains, or palpitations.    Ganglion Cyst A ganglion cyst is a noncancerous, fluid-filled lump that occurs near joints or tendons. The ganglion cyst grows out of a joint or the lining of a tendon. It most often develops in the hand or wrist, but it can also develop in the shoulder, elbow, hip, knee, ankle, or foot. The round or oval ganglion cyst can be the size of a pea or larger than a grape. Increased activity may enlarge the size of the cyst because more fluid starts to build up. What are the causes? It is not known what causes a ganglion cyst to grow. However, it may be related to:  Inflammation or irritation around the joint.  An injury.  Repetitive movements or overuse.  Arthritis. What increases the risk? Risk factors include:  Being a woman.  Being age 41-50. What are the signs or symptoms? Symptoms may include:  A lump. This most often appears on the hand or wrist, but it can occur in other areas of the body.  Tingling.  Pain.  Numbness.  Muscle weakness.  Weak grip.  Less movement in a joint. How is this diagnosed? Ganglion cysts are most often diagnosed based on a physical exam. Your health care provider will feel the lump and may shine a light alongside it. If it is a ganglion cyst, a light often shines through it. Your health care provider may order an X-ray, ultrasound, or MRI to rule out other conditions. How is this treated? Ganglion cysts usually go away on their own without treatment. If  pain or other symptoms are involved, treatment may be needed. Treatment is also needed if the ganglion cyst limits your movement or if it gets infected. Treatment may include:  Wearing a brace or splint on your wrist or finger.  Taking anti-inflammatory medicine.  Draining fluid from the lump with a needle (aspiration).  Injecting a steroid into the joint.  Surgery to remove the ganglion cyst. Follow these instructions at home:  Do not press on the ganglion cyst, poke it with a needle, or hit it.  Take medicines only as directed by your health care provider.  Wear your brace or splint as directed by your health care provider.  Watch your ganglion cyst for any changes.  Keep all follow-up visits as directed by your health care provider. This is important. Contact a health care provider if:  Your ganglion cyst becomes larger or more painful.  You have increased redness, red streaks, or swelling.  You have pus coming from the lump.  You have weakness or numbness in the affected area.  You have a fever or chills. This information is not intended to replace advice given to you by your health care provider. Make sure you discuss any questions you have with your health care provider. Document Released: 12/31/1999 Document Revised: 06/10/2015 Document Reviewed: 06/17/2013 Elsevier Interactive Patient Education  2017 [employer].

## 2016-03-21 LAB — LIPID PANEL W/REFLEX DIRECT LDL
Cholesterol: 158 mg/dL (ref <200)
HDL: 31 mg/dL — ABNORMAL LOW (ref >40)
LDL-Cholesterol: 94 mg/dL
Non-HDL Cholesterol (Calc): 127 mg/dL (ref <130)
Total CHOL/HDL Ratio: 5.1 Ratio — ABNORMAL HIGH (ref <5.0)
Triglycerides: 238 mg/dL — ABNORMAL HIGH (ref <150)

## 2016-03-21 LAB — CBC
HCT: 45.8 % (ref 38.5–50.0)
Hemoglobin: 15.3 g/dL (ref 13.2–17.1)
MCH: 28.8 pg (ref 27.0–33.0)
MCHC: 33.4 g/dL (ref 32.0–36.0)
MCV: 86.3 fL (ref 80.0–100.0)
MPV: 10.5 fL (ref 7.5–12.5)
Platelets: 314 10*3/uL (ref 140–400)
RBC: 5.31 MIL/uL (ref 4.20–5.80)
RDW: 13.5 % (ref 11.0–15.0)
WBC: 8.8 10*3/uL (ref 3.8–10.8)

## 2016-03-21 LAB — COMPLETE METABOLIC PANEL WITH GFR
ALT: 35 U/L (ref 9–46)
AST: 24 U/L (ref 10–40)
Albumin: 4.3 g/dL (ref 3.6–5.1)
Alkaline Phosphatase: 52 U/L (ref 40–115)
BUN: 13 mg/dL (ref 7–25)
CO2: 30 mmol/L (ref 20–31)
Calcium: 9.6 mg/dL (ref 8.6–10.3)
Chloride: 105 mmol/L (ref 98–110)
Creat: 0.99 mg/dL (ref 0.60–1.35)
GFR, Est African American: >89 mL/min (ref >=60)
GFR, Est Non African American: 89 mL/min (ref >=60)
Glucose, Bld: 98 mg/dL (ref 65–99)
Potassium: 4.6 mmol/L (ref 3.5–5.3)
Sodium: 141 mmol/L (ref 135–146)
Total Bilirubin: 0.7 mg/dL (ref 0.2–1.2)
Total Protein: 7.2 g/dL (ref 6.1–8.1)

## 2016-03-21 LAB — VITAMIN D 25 HYDROXY (VIT D DEFICIENCY, FRACTURES): Vit D, 25-Hydroxy: 42 ng/mL (ref 30–100)

## 2016-04-23 ENCOUNTER — Encounter: Payer: Self-pay | Admitting: Family Medicine

## 2016-04-25 MED ORDER — MESALAMINE 1.2 G PO TBEC: 2.4000 g | DELAYED_RELEASE_TABLET | Freq: Every day | ORAL | 1 refills | Status: DC

## 2016-04-26 ENCOUNTER — Other Ambulatory Visit: Payer: Self-pay | Admitting: Family Medicine

## 2016-04-26 DIAGNOSIS — J45909 Unspecified asthma, uncomplicated: Secondary | ICD-10-CM

## 2016-05-17 ENCOUNTER — Other Ambulatory Visit: Payer: Self-pay | Admitting: Gastroenterology

## 2016-05-22 ENCOUNTER — Encounter (HOSPITAL_COMMUNITY): Admission: RE | Payer: Self-pay | Source: Ambulatory Visit

## 2016-05-22 ENCOUNTER — Ambulatory Visit (HOSPITAL_COMMUNITY)
Admission: RE | Admit: 2016-05-22 | Payer: Commercial Managed Care - HMO | Source: Ambulatory Visit | Admitting: Gastroenterology

## 2016-05-22 SURGERY — COLONOSCOPY WITH PROPOFOL
Anesthesia: Monitor Anesthesia Care

## 2016-05-23 ENCOUNTER — Telehealth: Payer: Self-pay | Admitting: Gastroenterology

## 2016-05-23 NOTE — Telephone Encounter (Signed)
Received referral from [facility] (Dr. Earle Gell) to schedule patient an OV to discuss colon and EGD.   Dr. Earle Gell is retiring and is wanting patient to be seen by Dr. Havery Moros. Records placed on his desk for review.

## 2016-05-26 NOTE — Telephone Encounter (Signed)
Dr. Havery Moros reviewed records and said to schedule an OV.  Spoke with patient and he wants to wait until November 2018 to schedule.  Records placed in records reviewed filing cabinet.

## 2016-05-27 ENCOUNTER — Other Ambulatory Visit: Payer: Self-pay | Admitting: Family Medicine

## 2016-05-27 DIAGNOSIS — J45909 Unspecified asthma, uncomplicated: Secondary | ICD-10-CM

## 2016-06-09 ENCOUNTER — Ambulatory Visit (INDEPENDENT_AMBULATORY_CARE_PROVIDER_SITE_OTHER): Payer: Commercial Managed Care - HMO | Admitting: Family Medicine

## 2016-06-09 ENCOUNTER — Encounter: Payer: Self-pay | Admitting: Family Medicine

## 2016-06-09 VITALS — BP 129/76 | HR 65 | Wt 216.0 lb

## 2016-06-09 DIAGNOSIS — J45909 Unspecified asthma, uncomplicated: Secondary | ICD-10-CM | POA: Diagnosis not present

## 2016-06-09 DIAGNOSIS — J302 Other seasonal allergic rhinitis: Secondary | ICD-10-CM | POA: Diagnosis not present

## 2016-06-09 DIAGNOSIS — E785 Hyperlipidemia, unspecified: Secondary | ICD-10-CM | POA: Diagnosis not present

## 2016-06-09 DIAGNOSIS — J309 Allergic rhinitis, unspecified: Secondary | ICD-10-CM | POA: Insufficient documentation

## 2016-06-09 MED ORDER — AZELASTINE HCL 0.1 % NA SOLN: 2.0000 | Freq: Two times a day (BID) | NASAL | 12 refills | Status: DC

## 2016-06-09 MED ORDER — BECLOMETHASONE DIPROPIONATE 80 MCG/ACT NA AERS: 1.0000 | INHALATION_SPRAY | Freq: Every day | NASAL | 12 refills | Status: DC

## 2016-06-09 MED ORDER — FLUTICASONE FUROATE-VILANTEROL 100-25 MCG/INH IN AEPB: 1.0000 | INHALATION_SPRAY | Freq: Every day | RESPIRATORY_TRACT | 12 refills | Status: DC

## 2016-06-09 MED ORDER — MONTELUKAST SODIUM 10 MG PO TABS: 10.0000 mg | ORAL_TABLET | Freq: Every day | ORAL | 3 refills | Status: DC

## 2016-06-09 MED ORDER — ATORVASTATIN CALCIUM 10 MG PO TABS: 10.0000 mg | ORAL_TABLET | Freq: Every day | ORAL | 3 refills | Status: DC

## 2016-06-09 NOTE — Progress Notes (Signed)
Jason Brock is a 51 y.o. male who presents to [facility] today for asthma and allergies. Patient notes persistent bothersome seasonal allergies. This is typically worse in the spring and the fall. He notes his asthma is well controlled with Breo. He does not have to use albuterol frequently.  He does however note a very bothersome runny nose and posterior nasal drainage as well as nasal congestion. He takes Zyrtec, nasal steroids all of which are not very helpful. Additionally he will sometimes use Atrovent nasal spray which helps a little. He denies fevers chills vomiting or diarrhea. The symptoms are very bothersome and annoying to quality of life.  As for the lipidemia patient takes Lipitor daily and feels well with no chest pain palpitations or shortness of breath   Past Medical History:  Diagnosis Date  . Allergy   . Asthma    No past surgical history on file. Social History  Substance Use Topics  . Smoking status: Never Smoker  . Smokeless tobacco: Former Systems developer    Types: Snuff    Quit date: 12/21/2004  . Alcohol use 0.0 - 6.0 oz/week   family history includes Cancer in his mother; Depression in his brother; Heart disease in his father.  ROS as above:  Medications: Current Outpatient Prescriptions  Medication Sig Dispense Refill  . acetaminophen (TYLENOL) 500 MG tablet Take 1,000 mg by mouth every 6 (six) hours as needed for moderate pain or headache.    . Albuterol Sulfate (PROAIR RESPICLICK) 564 (90 Base) MCG/ACT AEPB Inhale 1-2 puffs into the lungs every 4 (four) hours as needed (wheeze). 1 each 2  . AMBULATORY NON FORMULARY MEDICATION Continuous positive airway pressure (CPAP) titrate setting per protcal of H2O pressure, with all supplemental supplies as needed. 1 each 0  . Ascorbic Acid (VITAMIN C) 1000 MG tablet Take 2,000 mg by mouth daily.    Marland Kitchen  atorvastatin (LIPITOR) 10 MG tablet Take 1 tablet (10 mg total) by mouth daily. 90 tablet 3  . Budesonide (RHINOCORT ALLERGY NA) Place 1 spray into the nose daily.     . Cholecalciferol (VITAMIN D3) 2000 units capsule Take 2,000 Units by mouth daily.    Marland Kitchen CLOBETASOL & CLOBETASOL EMUL EX Apply 1 application topically daily as needed (eczema).     . fluticasone furoate-vilanterol (BREO ELLIPTA) 100-25 MCG/INH AEPB Inhale 1 puff into the lungs daily. 60 each 12  . ibuprofen (ADVIL,MOTRIN) 200 MG tablet Take 400-800 mg by mouth every 6 (six) hours as needed (inflammation).    Marland Kitchen levocetirizine (XYZAL) 5 MG tablet TAKE 1 TABLET (5 MG TOTAL) BY MOUTH EVERY EVENING. (Patient taking differently: Take 5 mg by mouth daily. ) 90 tablet 2  . mesalamine (LIALDA) 1.2 g EC tablet Take 2 tablets (2.4 g total) by mouth daily with breakfast. (Patient taking differently: Take 2.4 g by mouth daily as needed (ulcerative flare). ) 60 tablet 1  . Multiple Vitamins-Minerals (MULTIVITAMIN ADULT PO) Take 1 tablet by mouth daily.     . Omega-3 Fatty Acids (FISH OIL) 1200 MG CAPS Take 2,400 mg by mouth 2 (two) times daily.    Marland Kitchen azelastine (ASTELIN) 0.1 % nasal spray Place 2 sprays into both nostrils 2 (two) times daily. Use in each nostril as directed 30 mL 12  . Beclomethasone Dipropionate (QNASL) 80 MCG/ACT AERS Place 1 puff into the nose daily. 1 Inhaler 12  . montelukast (SINGULAIR) 10 MG tablet Take 1 tablet (  10 mg total) by mouth at bedtime. 30 tablet 3   No current facility-administered medications for this visit.    No Known Allergies  Health Maintenance Health Maintenance  Topic Date Due  . INFLUENZA VACCINE  08/16/2016  . TETANUS/TDAP  03/21/2026  . HIV Screening  Completed     Exam:  BP 129/76   Pulse 65   Wt 216 lb (98 kg)   SpO2 99%   BMI 30.99 kg/m   Wt Readings from Last 10 Encounters:  06/09/16 216 lb (98 kg)  03/20/16 217 lb (98.4 kg)  04/29/15 205 lb (93 kg)  04/08/15 212 lb (96.2 kg)    03/09/15 207 lb (93.9 kg)  12/22/14 204 lb (92.5 kg)  04/08/13 185 lb (83.9 kg)    Gen: Well NAD HEENT: EOMI,  MMM Clear nasal discharge. Nasal turbinates are inflamed bilaterally. Posterior pharynx with cobblestoning. No cervical lymphadenopathy normal tympanic membranes frontal and maxillary sinuses are nontender.  Lungs: Normal work of breathing. CTABL Heart: RRR no MRG Abd: NABS, Soft. Nondistended, Nontender Exts: Brisk capillary refill, warm and well perfused.    Lab Results  Component Value Date   CHOL 158 03/20/2016   HDL 31 (L) 03/20/2016   LDLCALC 60 04/29/2015   TRIG 238 (H) 03/20/2016   CHOLHDL 5.1 (H) 03/20/2016     Chemistry      Component Value Date/Time   NA 141 03/20/2016 1157   K 4.6 03/20/2016 1157   CL 105 03/20/2016 1157   CO2 30 03/20/2016 1157   BUN 13 03/20/2016 1157   CREATININE 0.99 03/20/2016 1157      Component Value Date/Time   CALCIUM 9.6 03/20/2016 1157   ALKPHOS 52 03/20/2016 1157   AST 24 03/20/2016 1157   ALT 35 03/20/2016 1157   BILITOT 0.7 03/20/2016 1157        Assessment and Plan: 51 y.o. male with  Seasonal rhinitis. Very bothersome. We'll maximize therapy. Continue oral nonsedating antihistamine such as Zyrtec, as well as nasal steroid. We'll try to use Qnasl. If we are unable to get this medication will double up on the nasal steroid spray that he is currently using. Additionally we'll add Astelin nasal spray as well as Singulair.  As for asthma seems to be well-controlled continue current management.   Hyperlipidemia seems well-controlled continue current management  Recheck if not improved  No orders of the defined types were placed in this encounter.  Meds ordered this encounter  Medications  . Beclomethasone Dipropionate (QNASL) 80 MCG/ACT AERS    Sig: Place 1 puff into the nose daily.    Dispense:  1 Inhaler    Refill:  12  . montelukast (SINGULAIR) 10 MG tablet    Sig: Take 1 tablet (10 mg total) by mouth  at bedtime.    Dispense:  30 tablet    Refill:  3  . fluticasone furoate-vilanterol (BREO ELLIPTA) 100-25 MCG/INH AEPB    Sig: Inhale 1 puff into the lungs daily.    Dispense:  60 each    Refill:  12  . atorvastatin (LIPITOR) 10 MG tablet    Sig: Take 1 tablet (10 mg total) by mouth daily.    Dispense:  90 tablet    Refill:  3  . azelastine (ASTELIN) 0.1 % nasal spray    Sig: Place 2 sprays into both nostrils 2 (two) times daily. Use in each nostril as directed    Dispense:  30 mL    Refill:  12  Discussed warning signs or symptoms. Please see discharge instructions. Patient expresses understanding.

## 2016-06-09 NOTE — Patient Instructions (Addendum)
Thank you for coming in today. Use Qnasl if able.  If not able to get qnasl double up on the nasal spray rhinocort.  Also use astelin spray as needed.   Add singular.  Recheck in March  Allergic Rhinitis Allergic rhinitis is when the mucous membranes in the nose respond to allergens. Allergens are particles in the air that cause your body to have an allergic reaction. This causes you to release allergic antibodies. Through a chain of events, these eventually cause you to release histamine into the blood stream. Although meant to protect the body, it is this release of histamine that causes your discomfort, such as frequent sneezing, congestion, and an itchy, runny nose. What are the causes? Seasonal allergic rhinitis (hay fever) is caused by pollen allergens that may come from grasses, trees, and weeds. Year-round allergic rhinitis (perennial allergic rhinitis) is caused by allergens such as house dust mites, pet dander, and mold spores. What are the signs or symptoms?  Nasal stuffiness (congestion).  Itchy, runny nose with sneezing and tearing of the eyes. How is this diagnosed? Your health care provider can help you determine the allergen or allergens that trigger your symptoms. If you and your health care provider are unable to determine the allergen, skin or blood testing may be used. Your health care provider will diagnose your condition after taking your health history and performing a physical exam. Your health care provider may assess you for other related conditions, such as asthma, pink eye, or an ear infection. How is this treated? Allergic rhinitis does not have a cure, but it can be controlled by:  Medicines that block allergy symptoms. These may include allergy shots, nasal sprays, and oral antihistamines.  Avoiding the allergen. Hay fever may often be treated with antihistamines in pill or nasal spray forms. Antihistamines block the effects of histamine. There are  over-the-counter medicines that may help with nasal congestion and swelling around the eyes. Check with your health care provider before taking or giving this medicine. If avoiding the allergen or the medicine prescribed do not work, there are many new medicines your health care provider can prescribe. Stronger medicine may be used if initial measures are ineffective. Desensitizing injections can be used if medicine and avoidance does not work. Desensitization is when a patient is given ongoing shots until the body becomes less sensitive to the allergen. Make sure you follow up with your health care provider if problems continue. Follow these instructions at home: It is not possible to completely avoid allergens, but you can reduce your symptoms by taking steps to limit your exposure to them. It helps to know exactly what you are allergic to so that you can avoid your specific triggers. Contact a health care provider if:  You have a fever.  You develop a cough that does not stop easily (persistent).  You have shortness of breath.  You start wheezing.  Symptoms interfere with normal daily activities. This information is not intended to replace advice given to you by your health care provider. Make sure you discuss any questions you have with your health care provider. Document Released: 09/27/2000 Document Revised: 09/03/2015 Document Reviewed: 09/09/2012 Elsevier Interactive Patient Education  2017 [employer].

## 2016-10-16 ENCOUNTER — Ambulatory Visit: Payer: Self-pay | Admitting: Family Medicine

## 2017-02-20 ENCOUNTER — Encounter: Payer: Self-pay | Admitting: Family Medicine

## 2017-02-21 ENCOUNTER — Encounter: Payer: Self-pay | Admitting: Family Medicine

## 2017-02-21 ENCOUNTER — Encounter: Payer: Self-pay | Admitting: Gastroenterology

## 2017-02-21 ENCOUNTER — Ambulatory Visit (INDEPENDENT_AMBULATORY_CARE_PROVIDER_SITE_OTHER): Payer: 59 | Admitting: Family Medicine

## 2017-02-21 VITALS — BP 117/82 | HR 74 | Wt 221.0 lb

## 2017-02-21 DIAGNOSIS — Z1211 Encounter for screening for malignant neoplasm of colon: Secondary | ICD-10-CM | POA: Diagnosis not present

## 2017-02-21 DIAGNOSIS — E782 Mixed hyperlipidemia: Secondary | ICD-10-CM

## 2017-02-21 DIAGNOSIS — Z0000 Encounter for general adult medical examination without abnormal findings: Secondary | ICD-10-CM

## 2017-02-21 DIAGNOSIS — Z125 Encounter for screening for malignant neoplasm of prostate: Secondary | ICD-10-CM

## 2017-02-21 DIAGNOSIS — J45909 Unspecified asthma, uncomplicated: Secondary | ICD-10-CM | POA: Diagnosis not present

## 2017-02-21 DIAGNOSIS — K529 Noninfective gastroenteritis and colitis, unspecified: Secondary | ICD-10-CM | POA: Diagnosis not present

## 2017-02-21 MED ORDER — ATORVASTATIN CALCIUM 10 MG PO TABS: 10.0000 mg | ORAL_TABLET | Freq: Every day | ORAL | 3 refills | Status: DC

## 2017-02-21 MED ORDER — FLUTICASONE FUROATE-VILANTEROL 100-25 MCG/INH IN AEPB: 1.0000 | INHALATION_SPRAY | Freq: Every day | RESPIRATORY_TRACT | 12 refills | Status: DC

## 2017-02-21 MED ORDER — MESALAMINE 1.2 G PO TBEC: 2.4000 g | DELAYED_RELEASE_TABLET | Freq: Every day | ORAL | 12 refills | Status: DC | PRN

## 2017-02-21 NOTE — Patient Instructions (Addendum)
Thank you for coming in today. Get labs today.  Work on calories try to get around 1750 calories a day 2000 a day with exercise.  Recheck in 6-12 months.  Continue medicine.  You should hear from [facility] soon.  We will continue to follow up hip.  If not better next step is Xray.

## 2017-02-21 NOTE — Progress Notes (Signed)
Jason Brock is a 52 y.o. male who presents to [place]: [facility] today for well adult visit.    Jason Brock is doing really well overall.  He notes that he is gained a bit of weight over the last several years with decreased exercise.  Additionally he notes about a 1 month history of pain in the right groin worse with active hip flexion.  Overall though he is feeling quite well with no depression symptoms chest pain palpitations or shortness of breath.  He is able to exercise fully with no problems.   Past Medical History:  Diagnosis Date  . Allergy   . Asthma    No past surgical history on file. Social History   Tobacco Use  . Smoking status: Never Smoker  . Smokeless tobacco: Former Systems developer    Types: Snuff  Substance Use Topics  . Alcohol use: Yes    Alcohol/week: 0.0 - 6.0 oz   family history includes Cancer in his mother; Depression in his brother; Heart disease in his father.  ROS as above:  Medications: Current Outpatient Medications  Medication Sig Dispense Refill  . acetaminophen (TYLENOL) 500 MG tablet Take 1,000 mg by mouth every 6 (six) hours as needed for moderate pain or headache.    . Albuterol Sulfate (PROAIR RESPICLICK) 314 (90 Base) MCG/ACT AEPB Inhale 1-2 puffs into the lungs every 4 (four) hours as needed (wheeze). 1 each 2  . AMBULATORY NON FORMULARY MEDICATION Continuous positive airway pressure (CPAP) titrate setting per protcal of H2O pressure, with all supplemental supplies as needed. 1 each 0  . Ascorbic Acid (VITAMIN C) 1000 MG tablet Take 2,000 mg by mouth daily.    Marland Kitchen atorvastatin (LIPITOR) 10 MG tablet Take 1 tablet (10 mg total) by mouth daily. 90 tablet 3  . azelastine (ASTELIN) 0.1 % nasal spray Place 2 sprays into both nostrils 2 (two) times daily. Use in each nostril as directed 30 mL 12  . Beclomethasone Dipropionate (QNASL) 80 MCG/ACT  AERS Place 1 puff into the nose daily. 1 Inhaler 12  . Budesonide (RHINOCORT ALLERGY NA) Place 1 spray into the nose daily.     . Cholecalciferol (VITAMIN D3) 2000 units capsule Take 2,000 Units by mouth daily.    Marland Kitchen CLOBETASOL & CLOBETASOL EMUL EX Apply 1 application topically daily as needed (eczema).     . fluticasone furoate-vilanterol (BREO ELLIPTA) 100-25 MCG/INH AEPB Inhale 1 puff into the lungs daily. 60 each 12  . ibuprofen (ADVIL,MOTRIN) 200 MG tablet Take 400-800 mg by mouth every 6 (six) hours as needed (inflammation).    Marland Kitchen levocetirizine (XYZAL) 5 MG tablet TAKE 1 TABLET (5 MG TOTAL) BY MOUTH EVERY EVENING. (Patient taking differently: Take 5 mg by mouth daily. ) 90 tablet 2  . mesalamine (LIALDA) 1.2 g EC tablet Take 2 tablets (2.4 g total) by mouth daily as needed (ulcerative flare). 30 tablet 12  . montelukast (SINGULAIR) 10 MG tablet Take 1 tablet (10 mg total) by mouth at bedtime. 30 tablet 3  . Multiple Vitamins-Minerals (MULTIVITAMIN ADULT PO) Take 1 tablet by mouth daily.     . Omega-3 Fatty Acids (FISH OIL) 1200 MG CAPS Take 2,400 mg by mouth 2 (two) times daily.     No current facility-administered medications for this visit.    No Known Allergies  Health Maintenance Health Maintenance  Topic Date Due  . COLONOSCOPY  11/17/2016  . TETANUS/TDAP  03/21/2026  . INFLUENZA  VACCINE  Completed  . HIV Screening  Completed     Exam:  BP 117/82   Pulse 74   Wt 221 lb (100.2 kg)   BMI 31.71 kg/m   Wt Readings from Last 5 Encounters:  02/21/17 221 lb (100.2 kg)  06/09/16 216 lb (98 kg)  03/20/16 217 lb (98.4 kg)  04/29/15 205 lb (93 kg)  04/08/15 212 lb (96.2 kg)    Gen: Well NAD HEENT: EOMI,  MMM Lungs: Normal work of breathing. CTABL Heart: RRR no MRG Abd: NABS, Soft. Nondistended, Nontender Exts: Brisk capillary refill, warm and well perfused.  Skin: Seborrheic keratosis present no dysplastic appearing lesions present. Right hip normal-appearing nontender  normal motion pain with flexion and internal motion referred to the groin Normal gait.   No results found for this or any previous visit (from the past 72 hour(s)). No results found.    Assessment and Plan: 52 y.o. male with  Well adult doing well.  Check basic fasting labs listed below including PSA for prostate cancer screening.  Refer to gastroenterology for colon cancer screening.  Work on reduced calorie diet for weight loss (1750 calories a day goal) and continue regular exercise.  If hip pain does not improve in a month we will recheck for further workup likely including x-ray to evaluate for impingement or DJD.   Orders Placed This Encounter  Procedures  . CBC  . COMPLETE METABOLIC PANEL WITH GFR  . Lipid Panel w/reflex Direct LDL  . PSA  . Ambulatory referral to Gastroenterology    Referral Priority:   Routine    Referral Type:   Consultation    Referral Reason:   Specialty Services Required    Number of Visits Requested:   1   Meds ordered this encounter  Medications  . mesalamine (LIALDA) 1.2 g EC tablet    Sig: Take 2 tablets (2.4 g total) by mouth daily as needed (ulcerative flare).    Dispense:  30 tablet    Refill:  12  . atorvastatin (LIPITOR) 10 MG tablet    Sig: Take 1 tablet (10 mg total) by mouth daily.    Dispense:  90 tablet    Refill:  3  . fluticasone furoate-vilanterol (BREO ELLIPTA) 100-25 MCG/INH AEPB    Sig: Inhale 1 puff into the lungs daily.    Dispense:  60 each    Refill:  12     Discussed warning signs or symptoms. Please see discharge instructions. Patient expresses understanding.

## 2017-02-22 LAB — CBC
HCT: 44.5 % (ref 38.5–50.0)
Hemoglobin: 15.2 g/dL (ref 13.2–17.1)
MCH: 28.6 pg (ref 27.0–33.0)
MCHC: 34.2 g/dL (ref 32.0–36.0)
MCV: 83.8 fL (ref 80.0–100.0)
MPV: 11.5 fL (ref 7.5–12.5)
Platelets: 289 10*3/uL (ref 140–400)
RBC: 5.31 10*6/uL (ref 4.20–5.80)
RDW: 13.1 % (ref 11.0–15.0)
WBC: 8.2 10*3/uL (ref 3.8–10.8)

## 2017-02-22 LAB — COMPLETE METABOLIC PANEL WITH GFR
AG Ratio: 1.4 (calc) (ref 1.0–2.5)
ALT: 36 U/L (ref 9–46)
AST: 23 U/L (ref 10–35)
Albumin: 4.3 g/dL (ref 3.6–5.1)
Alkaline phosphatase (APISO): 53 U/L (ref 40–115)
BUN: 19 mg/dL (ref 7–25)
CO2: 26 mmol/L (ref 20–32)
Calcium: 9.7 mg/dL (ref 8.6–10.3)
Chloride: 106 mmol/L (ref 98–110)
Creat: 1.11 mg/dL (ref 0.70–1.33)
GFR, Est African American: 89 mL/min/{1.73_m2} (ref >=60)
GFR, Est Non African American: 77 mL/min/{1.73_m2} (ref >=60)
Globulin: 3 g/dL (calc) (ref 1.9–3.7)
Glucose, Bld: 96 mg/dL (ref 65–99)
Potassium: 4.8 mmol/L (ref 3.5–5.3)
Sodium: 141 mmol/L (ref 135–146)
Total Bilirubin: 0.6 mg/dL (ref 0.2–1.2)
Total Protein: 7.3 g/dL (ref 6.1–8.1)

## 2017-02-22 LAB — LIPID PANEL W/REFLEX DIRECT LDL
Cholesterol: 146 mg/dL (ref <200)
HDL: 33 mg/dL — ABNORMAL LOW (ref >40)
LDL Cholesterol (Calc): 80 mg/dL (calc)
Non-HDL Cholesterol (Calc): 113 mg/dL (calc) (ref <130)
Total CHOL/HDL Ratio: 4.4 (calc) (ref <5.0)
Triglycerides: 254 mg/dL — ABNORMAL HIGH (ref <150)

## 2017-02-22 LAB — PSA: PSA: 0.6 ng/mL (ref <=4.0)

## 2017-04-17 ENCOUNTER — Ambulatory Visit: Payer: 59 | Admitting: Gastroenterology

## 2017-04-17 ENCOUNTER — Encounter: Payer: Self-pay | Admitting: Gastroenterology

## 2017-04-17 VITALS — BP 120/80 | HR 82 | Ht 69.0 in | Wt 226.0 lb

## 2017-04-17 DIAGNOSIS — K5090 Crohn's disease, unspecified, without complications: Secondary | ICD-10-CM | POA: Diagnosis not present

## 2017-04-17 DIAGNOSIS — Z1211 Encounter for screening for malignant neoplasm of colon: Secondary | ICD-10-CM

## 2017-04-17 MED ORDER — SUPREP BOWEL PREP KIT 17.5-3.13-1.6 GM/177ML PO SOLN: ORAL | 0 refills | Status: DC

## 2017-04-17 NOTE — Patient Instructions (Addendum)
If you are age 51 or older, your body mass index should be between 23-30. Your Body mass index is 33.37 kg/m. If this is out of the aforementioned range listed, please consider follow up with your Primary Care Provider.  If you are age 55 or younger, your body mass index should be between 19-25. Your Body mass index is 33.37 kg/m. If this is out of the aformentioned range listed, please consider follow up with your Primary Care Provider.   You have been scheduled for a colonoscopy. Please follow written instructions given to you at your visit today.  Please pick up your prep supplies at the pharmacy within the next 1-3 days. If you use inhalers (even only as needed), please bring them with you on the day of your procedure. Your physician has requested that you go to www.startemmi.com and enter the access code given to you at your visit today. This web site gives a general overview about your procedure. However, you should still follow specific instructions given to you by our office regarding your preparation for the procedure.  Please discontinue using NSAIDS such as Ibuprofen. Use Tylenol as needed.   Thank you for entrusting me with your care and for choosing [facility], Dr. Damascus Cellar

## 2017-04-17 NOTE — Progress Notes (Signed)
HPI :   52 year old pleasant male with a history of inflammatory bowel disease, seasonal allergies, asthma, referred by Dr. Earle Gell for long-term management of inflammatory bowel disease.  Per review of Dr. Tillman Sers records and discussion with the patient, he was diagnosed with suspected Crohn's colitis in 2009, when he presented with diarrhea and urgency of his stools. His colonoscopy at that time was notable for moderate colitis starting at 60 cm from the anal verge and continued through the cecum. Pathology was consistent with chronic active colitis. Normal ileum. At the same time he had an upper endoscopy with a normal-appearing duodenum, with biopsies showing focal active chronic duodenitis. I don't see any prior serologies for celiac disease.  He reports since his diagnosis he was managed with mesalamine monotherapy. He has been on Lialda 2.4 g per day for the most part since that time. He states this does work well to control his symptoms at baseline. He generally feels well and has one bowel movement per day. He denies any blood in his stools, however they can be loose frequently. He does have some urgency at times with his bowel movements after he eats. He does have occasional generalized discomfort of his abdomen at times, he denies relief with a bowel movement. This is sporadic. Sometimes he feels his symptoms may flare and takes 4 tablets of his Lialda at a time and he states this may help reduce his abdominal discomfort. He denies any weight loss, does endorse some weight gain. He denies any nausea or vomiting, no reflux symptoms of bother him. He does have some occasional dysphagia located in the sternal notch at times. He has never been hospitalized for his Crohn's disease or colitis in the past.  He endorses taking ibuprofen a few times a week for the past 2-3 years. He drinks about once per week and is concerned about potential interactions with Tylenol. He denies any family  history of inflammatory bowel disease but his mother does have celiac disease.  Prior workup: Colonoscopy 07/08/2007 - moderate colitis from 60cm to the cecum. Normal ileum - path shows chronic active colitis EGD 07/08/2007 - irregular z line, normal exam otherwise - biopsies show "focal active chronic duodenitis",    Past Medical History:  Diagnosis Date  . Allergy   . Asthma   . Moderate ulcerative colitis ([place]) 2009   Dr. Earle Gell colonoscopy report     History reviewed. No pertinent surgical history. Family History  Problem Relation Age of Onset  . Cancer Mother   . Heart disease Father   . Depression Brother    Social History   Tobacco Use  . Smoking status: Never Smoker  . Smokeless tobacco: Former Systems developer    Types: Snuff  Substance Use Topics  . Alcohol use: Yes    Alcohol/week: 0.0 - 6.0 oz  . Drug use: No   Current Outpatient Medications  Medication Sig Dispense Refill  . acetaminophen (TYLENOL) 500 MG tablet Take 1,000 mg by mouth every 6 (six) hours as needed for moderate pain or headache.    . Albuterol Sulfate (PROAIR RESPICLICK) 827 (90 Base) MCG/ACT AEPB Inhale 1-2 puffs into the lungs every 4 (four) hours as needed (wheeze). 1 each 2  . AMBULATORY NON FORMULARY MEDICATION Continuous positive airway pressure (CPAP) titrate setting per protcal of H2O pressure, with all supplemental supplies as needed. 1 each 0  . Ascorbic Acid (VITAMIN C) 1000 MG tablet Take 2,000 mg by mouth daily.    Marland Kitchen  atorvastatin (LIPITOR) 10 MG tablet Take 1 tablet (10 mg total) by mouth daily. 90 tablet 3  . azelastine (ASTELIN) 0.1 % nasal spray Place 2 sprays into both nostrils 2 (two) times daily. Use in each nostril as directed 30 mL 12  . Beclomethasone Dipropionate (QNASL) 80 MCG/ACT AERS Place 1 puff into the nose daily. 1 Inhaler 12  . Budesonide (RHINOCORT ALLERGY NA) Place 1 spray into the nose daily.     . Cholecalciferol (VITAMIN D3) 2000 units capsule Take 2,000 Units by  mouth daily.    Marland Kitchen CLOBETASOL & CLOBETASOL EMUL EX Apply 1 application topically daily as needed (eczema).     . fluticasone furoate-vilanterol (BREO ELLIPTA) 100-25 MCG/INH AEPB Inhale 1 puff into the lungs daily. 60 each 12  . ibuprofen (ADVIL,MOTRIN) 200 MG tablet Take 400-800 mg by mouth every 6 (six) hours as needed (inflammation).    Marland Kitchen levocetirizine (XYZAL) 5 MG tablet TAKE 1 TABLET (5 MG TOTAL) BY MOUTH EVERY EVENING. (Patient taking differently: Take 5 mg by mouth daily. ) 90 tablet 2  . mesalamine (LIALDA) 1.2 g EC tablet Take 2 tablets (2.4 g total) by mouth daily as needed (ulcerative flare). 30 tablet 12  . montelukast (SINGULAIR) 10 MG tablet Take 1 tablet (10 mg total) by mouth at bedtime. 30 tablet 3  . Multiple Vitamins-Minerals (MULTIVITAMIN ADULT PO) Take 1 tablet by mouth daily.     . Omega-3 Fatty Acids (FISH OIL) 1200 MG CAPS Take 2,400 mg by mouth 2 (two) times daily.     No current facility-administered medications for this visit.    No Known Allergies   Review of Systems: All systems reviewed and negative except where noted in HPI.   Lab Results  Component Value Date   WBC 8.2 02/21/2017   HGB 15.2 02/21/2017   HCT 44.5 02/21/2017   MCV 83.8 02/21/2017   PLT 289 02/21/2017    Lab Results  Component Value Date   CREATININE 1.11 02/21/2017   BUN 19 02/21/2017   NA 141 02/21/2017   K 4.8 02/21/2017   CL 106 02/21/2017   CO2 26 02/21/2017    Lab Results  Component Value Date   ALT 36 02/21/2017   AST 23 02/21/2017   ALKPHOS 52 03/20/2016   BILITOT 0.6 02/21/2017     Physical Exam: BP 120/80   Pulse 82   Ht 5' 9"  (1.753 m)   Wt 226 lb (102.5 kg)   BMI 33.37 kg/m  Constitutional: Pleasant,well-developed, male in no acute distress. HEENT: Normocephalic and atraumatic. Conjunctivae are normal. No scleral icterus. Neck supple.  Cardiovascular: Normal rate, regular rhythm.  Pulmonary/chest: Effort normal and breath sounds normal. No wheezing,  rales or rhonchi. Abdominal: Soft, nondistended, nontender. There are no masses palpable. No hepatomegaly. Extremities: no edema Lymphadenopathy: No cervical adenopathy noted. Neurological: Alert and oriented to person place and time. Skin: Skin is warm and dry. No rashes noted. Psychiatric: Normal mood and affect. Behavior is normal.   ASSESSMENT AND PLAN: 52 year old male with a reported history of inflammatory bowel disease, here to establish care and for long-term management.  Inflammatory bowel disease - based off prior records this is most consistent with Crohn's colitis, although small bowel inflammation on biopsies noted, unclear if he had small bowel involvement with Crohn's disease versus possibly celiac disease at the time of initial EGD. We discussed what inflammatory bowel disease is. He has responded quite well to management with mesalamine over the years, thus colonic involvement was  likely location for the burden of his disease / driving his symptoms. His labs show no evidence of anemia and is reassuring. Recommending a colonoscopy at this time not only for screening purposes but to reassess / restage his inflammatory bowel disease, assess for mucosal healing on mesalamine. We discussed what a colonoscopy is and potential risks and benefits. I'm also recommending an upper endoscopy to reevaluate the small bowel and assess for active inflammation. If this is present he would benefit from celiac serologies as well. He agreed to upper endoscopy as well following this conversation. He'll continue his mesalamine for now. I recommend he avoid NSAIDs given the risk for flaring his disease. I advised him to use low-dose Tylenol as needed which is much safer for him. He agreed with the plan, further recommendations pending results.  Graham Cellar, MD [facility]   CC: Gregor Hams, MD

## 2017-05-02 ENCOUNTER — Ambulatory Visit (INDEPENDENT_AMBULATORY_CARE_PROVIDER_SITE_OTHER): Payer: 59

## 2017-05-02 ENCOUNTER — Encounter: Payer: Self-pay | Admitting: Family Medicine

## 2017-05-02 ENCOUNTER — Ambulatory Visit: Payer: 59 | Admitting: Family Medicine

## 2017-05-02 VITALS — BP 120/74 | HR 67 | Ht 69.02 in | Wt 225.0 lb

## 2017-05-02 DIAGNOSIS — M160 Bilateral primary osteoarthritis of hip: Secondary | ICD-10-CM | POA: Diagnosis not present

## 2017-05-02 DIAGNOSIS — M25551 Pain in right hip: Secondary | ICD-10-CM

## 2017-05-02 DIAGNOSIS — Z6833 Body mass index (BMI) 33.0-33.9, adult: Secondary | ICD-10-CM

## 2017-05-02 NOTE — Patient Instructions (Addendum)
Thank you for coming in today. 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 Work on hip flexor stetching.  If not better let me know next step is likely MRI with injection.   Let me know.

## 2017-05-02 NOTE — Progress Notes (Signed)
Jason Brock is a 52 y.o. male who presents to [place] today for right anterior hip pain.  Jason Brock was seen in February for well adult visit.  At that time he had been having pain in the right anterior hip for a few months.  He notes pain is worse with prolonged hip flexion and especially bothersome when getting in a car.  He denies any injury.  He has the pain is persistent and bothersome at times.  He has been doing some home stretching and exercises which have helped only a little.  He denies any bowel or bladder dysfunction fevers or chills nausea vomiting diarrhea or scrotum swelling.   Past Medical History:  Diagnosis Date  . Allergy   . Asthma   . Moderate ulcerative colitis ([place]) 2009   Dr. Earle Gell colonoscopy report   No past surgical history on file. Social History   Tobacco Use  . Smoking status: Never Smoker  . Smokeless tobacco: Former Systems developer    Types: Snuff  Substance Use Topics  . Alcohol use: Yes    Alcohol/week: 0.0 - 6.0 oz     ROS:  As above   Medications: Current Outpatient Medications  Medication Sig Dispense Refill  . acetaminophen (TYLENOL) 500 MG tablet Take 1,000 mg by mouth every 6 (six) hours as needed for moderate pain or headache.    . Albuterol Sulfate (PROAIR RESPICLICK) 283 (90 Base) MCG/ACT AEPB Inhale 1-2 puffs into the lungs every 4 (four) hours as needed (wheeze). 1 each 2  . AMBULATORY NON FORMULARY MEDICATION Continuous positive airway pressure (CPAP) titrate setting per protcal of H2O pressure, with all supplemental supplies as needed. 1 each 0  . Ascorbic Acid (VITAMIN C) 1000 MG tablet Take 2,000 mg by mouth daily.    Marland Kitchen atorvastatin (LIPITOR) 10 MG tablet Take 1 tablet (10 mg total) by mouth daily. 90 tablet 3  . azelastine (ASTELIN) 0.1 % nasal spray Place 2 sprays into both nostrils 2 (two) times daily. Use in each nostril as directed 30 mL 12  . Beclomethasone Dipropionate  (QNASL) 80 MCG/ACT AERS Place 1 puff into the nose daily. 1 Inhaler 12  . Budesonide (RHINOCORT ALLERGY NA) Place 1 spray into the nose daily.     . Cholecalciferol (VITAMIN D3) 2000 units capsule Take 2,000 Units by mouth daily.    Marland Kitchen CLOBETASOL & CLOBETASOL EMUL EX Apply 1 application topically daily as needed (eczema).     . fluticasone furoate-vilanterol (BREO ELLIPTA) 100-25 MCG/INH AEPB Inhale 1 puff into the lungs daily. 60 each 12  . ibuprofen (ADVIL,MOTRIN) 200 MG tablet Take 400-800 mg by mouth every 6 (six) hours as needed (inflammation).    Marland Kitchen levocetirizine (XYZAL) 5 MG tablet TAKE 1 TABLET (5 MG TOTAL) BY MOUTH EVERY EVENING. (Patient taking differently: Take 5 mg by mouth daily. ) 90 tablet 2  . mesalamine (LIALDA) 1.2 g EC tablet Take 2 tablets (2.4 g total) by mouth daily as needed (ulcerative flare). 30 tablet 12  . montelukast (SINGULAIR) 10 MG tablet Take 1 tablet (10 mg total) by mouth at bedtime. 30 tablet 3  . Multiple Vitamins-Minerals (MULTIVITAMIN ADULT PO) Take 1 tablet by mouth daily.     . Omega-3 Fatty Acids (FISH OIL) 1200 MG CAPS Take 2,400 mg by mouth 2 (two) times daily.    Manus Gunning BOWEL PREP KIT 17.5-3.13-1.6 GM/177ML SOLN Suprep-Use as directed 354 mL 0   No current facility-administered medications for this  visit.    No Known Allergies   Exam:  BP 120/74   Pulse 67   Ht 5' 9.02" (1.753 m)   Wt 225 lb (102.1 kg)   SpO2 98%   BMI 33.21 kg/m  General: Well Developed, well nourished, and in no acute distress.  Neuro/Psych: Alert and oriented x3, extra-ocular muscles intact, able to move all 4 extremities, sensation grossly intact. Skin: Warm and dry, no rashes noted.  Respiratory: Not using accessory muscles, speaking in full sentences, trachea midline.  Cardiovascular: Pulses palpable, no extremity edema. Abdomen: Does not appear distended. MSK:  Right hip normal-appearing nontender. Pain with flexion with limited flexion range of motion of the right  compared to the left. Internal rotation limited bilaterally at 10 degrees External rotation normal bilaterally.  Painful on the right internal and external rotation testing Strength is intact throughout. Normal gait.    No results found for this or any previous visit (from the past 48 hour(s)). Dg Hip Unilat With Pelvis 2-3 Views Right  Result Date: 05/02/2017 CLINICAL DATA:  Right groin pain for 5 months, no injury EXAM: DG HIP (WITH OR WITHOUT PELVIS) 2-3V RIGHT COMPARISON:  None. FINDINGS: There is very mild degenerative joint disease of the hips with some superior acetabular spurring present but well preserved joint spaces are noted. No fracture is seen. The pelvic rami are intact. The SI joints are corticated. 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 No acute abnormality. Electronically Signed   By: Ivar Drape M.D.   On: 05/02/2017 15:15      Assessment and Plan: 52 y.o. 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  We discussed options.  Plan for trial of dedicated home exercise program focusing on hip flexor stretching.  If not better next step would be referral to formal physical therapy or consider MRI arthrogram.  Established problem worsening uncertain prognosis.  BMI 33 is correlated with this problem as well.  Weight loss will help.  We discussed weight loss strategies as well.    Orders Placed This Encounter  Procedures  . DG HIP UNILAT WITH PELVIS 2-3 VIEWS RIGHT    Standing Status:   Future    Number of Occurrences:   1    Standing Expiration Date:   07/03/2018    Order Specific Question:   Reason for Exam (SYMPTOM  OR DIAGNOSIS REQUIRED)    Answer:   eval right hip pain with flexion concern for FAI    Order Specific Question:   Preferred imaging location?    Answer:   [facility]    Order Specific Question:   Radiology Contrast Protocol - do NOT remove file path    Answer:    [email or web address]   No orders of the defined types were placed in this encounter.   Discussed warning signs or symptoms. Please see discharge instructions. Patient expresses understanding.

## 2017-06-11 ENCOUNTER — Encounter: Payer: Self-pay | Admitting: Gastroenterology

## 2017-06-21 ENCOUNTER — Encounter: Payer: Self-pay | Admitting: Gastroenterology

## 2017-06-21 ENCOUNTER — Other Ambulatory Visit: Payer: Self-pay

## 2017-06-21 ENCOUNTER — Ambulatory Visit (AMBULATORY_SURGERY_CENTER): Payer: 59 | Admitting: Gastroenterology

## 2017-06-21 VITALS — BP 136/82 | HR 60 | Temp 99.1°F | Resp 9 | Ht 69.0 in | Wt 226.0 lb

## 2017-06-21 DIAGNOSIS — K219 Gastro-esophageal reflux disease without esophagitis: Secondary | ICD-10-CM | POA: Diagnosis not present

## 2017-06-21 DIAGNOSIS — Z1211 Encounter for screening for malignant neoplasm of colon: Secondary | ICD-10-CM | POA: Diagnosis not present

## 2017-06-21 DIAGNOSIS — D122 Benign neoplasm of ascending colon: Secondary | ICD-10-CM | POA: Diagnosis not present

## 2017-06-21 DIAGNOSIS — R1310 Dysphagia, unspecified: Secondary | ICD-10-CM

## 2017-06-21 DIAGNOSIS — Z8719 Personal history of other diseases of the digestive system: Secondary | ICD-10-CM

## 2017-06-21 DIAGNOSIS — K2980 Duodenitis without bleeding: Secondary | ICD-10-CM | POA: Diagnosis not present

## 2017-06-21 MED ORDER — SODIUM CHLORIDE 0.9 % IV SOLN
500.0000 mL | Freq: Once | INTRAVENOUS | Status: DC
Start: 2017-06-21 — End: 2018-05-10

## 2017-06-21 MED ORDER — OMEPRAZOLE 40 MG PO CPDR: 40.0000 mg | DELAYED_RELEASE_CAPSULE | Freq: Every day | ORAL | 1 refills | Status: DC

## 2017-06-21 NOTE — Progress Notes (Signed)
Report given to PACU, vss 

## 2017-06-21 NOTE — Op Note (Addendum)
[place] Patient Name: Jason Brock Procedure Date: 06/21/2017 11:22 AM MRN: [id number] Endoscopist: Remo Lipps P. Havery Moros , MD Age: 52 Referring MD:  Date of Birth: 04-07-1965 Gender: Male Account #: [id number] Procedure:                Upper GI endoscopy Indications:              Dysphagia, history of nonspecific duodenitis,                            reported remote history of possible Crohn's disease Medicines:                Monitored Anesthesia Care Procedure:                Pre-Anesthesia Assessment:                           - Prior to the procedure, a History and Physical                            was performed, and patient medications and                            allergies were reviewed. The patient's tolerance of                            previous anesthesia was also reviewed. The risks                            and benefits of the procedure and the sedation                            options and risks were discussed with the patient.                            All questions were answered, and informed consent                            was obtained. Prior Anticoagulants: The patient has                            taken no previous anticoagulant or antiplatelet                            agents. ASA Grade Assessment: II - A patient with                            mild systemic disease. After reviewing the risks                            and benefits, the patient was deemed in                            satisfactory condition to undergo the procedure.  After obtaining informed consent, the endoscope was                            passed under direct vision. Throughout the                            procedure, the patient's blood pressure, pulse, and                            oxygen saturations were monitored continuously. The                            Model GIF-HQ190 [id number]) scope was introduced   through the mouth, and advanced to the second part                            of duodenum. The upper GI endoscopy was                            accomplished without difficulty. The patient                            tolerated the procedure well. Scope In: Scope Out: Findings:                 Esophagogastric landmarks were identified: the                            Z-line was found at 43 cm, the gastroesophageal                            junction was found at 43 cm and the upper extent of                            the gastric folds was found at 43 cm from the                            incisors.                           Mucosal changes including ringed esophagus were                            found in the entire esophagus. Some erosions were                            noted in the distal esophagus as well. Biopsies                            were obtained from the proximal and distal                            esophagus with cold forceps for histology to rule  out eosinophilic esophagitis.                           Two areas of nodular mucosa were found at the                            gastroesophageal junction (3 o'clock position and 6                            o'clock position, about 20m in each). Hopefully                            this represents inflammatory changes but biopsies                            were taken with a cold forceps for histology to                            ensure no dysplasia / malignancy.                           The entire examined stomach was normal.                           Patchy mildly erythematous mucosa was found in the                            duodenal bulb.                           The exam of the duodenum was otherwise normal.                           Biopsies were taken with a cold forceps in the                            duodenal bulb and in the second portion of the                            duodenum for  histology. Complications:            No immediate complications. Estimated blood loss:                            Minimal. Estimated Blood Loss:     Estimated blood loss was minimal. Impression:               - Esophagogastric landmarks identified.                           - Esophageal mucosal changes suspicious for                            eosinophilic esophagitis. Biopsied.                           -  Distal esophagitis - mild.                           - Mucosal changes at the GEJ as described, could be                            inflammatory in the setting of reflux but biopsied                            to ensure no dysplasia / malignancy.                           - Normal stomach.                           - Erythematous duodenopathy.                           - Biopsies were taken with a cold forceps for                            histology in the duodenal bulb and in the second                            portion of the duodenum. Recommendation:           - Patient has a contact number available for                            emergencies. The signs and symptoms of potential                            delayed complications were discussed with the                            patient. Return to normal activities tomorrow.                            Written discharge instructions were provided to the                            patient.                           - Resume previous diet.                           - Continue present medications.                           - Recommend starting omeprazole 65m once daily                           - Await pathology results with further  recommendations Carlota Raspberry. Charnel Giles, MD 06/21/2017 12:04:31 PM This report has been signed electronically.

## 2017-06-21 NOTE — Patient Instructions (Signed)
YOU HAD AN ENDOSCOPIC PROCEDURE TODAY AT [facility]:   Refer to the procedure report that was given to you for any specific questions about what was found during the examination.  If the procedure report does not answer your questions, please call your gastroenterologist to clarify.  If you requested that your care partner not be given the details of your procedure findings, then the procedure report has been included in a sealed envelope for you to review at your convenience later.  YOU SHOULD EXPECT: Some feelings of bloating in the abdomen. Passage of more gas than usual.  Walking can help get rid of the air that was put into your GI tract during the procedure and reduce the bloating. If you had a lower endoscopy (such as a colonoscopy or flexible sigmoidoscopy) you may notice spotting of blood in your stool or on the toilet paper. If you underwent a bowel prep for your procedure, you may not have a normal bowel movement for a few days.  Please Note:  You might notice some irritation and congestion in your nose or some drainage.  This is from the oxygen used during your procedure.  There is no need for concern and it should clear up in a day or so.  SYMPTOMS TO REPORT IMMEDIATELY:   Following lower endoscopy (colonoscopy or flexible sigmoidoscopy):  Excessive amounts of blood in the stool  Significant tenderness or worsening of abdominal pains  Swelling of the abdomen that is new, acute  Fever of 100F or higher   Following upper endoscopy (EGD)  Vomiting of blood or coffee ground material  New chest pain or pain under the shoulder blades  Painful or persistently difficult swallowing  New shortness of breath  Fever of 100F or higher  Black, tarry-looking stools  For urgent or emergent issues, a gastroenterologist can be reached at any hour by calling [phone].  You are being started on a new medication Omeprazole 40 mg daily.  Please see handouts given to you  on Polyps.   DIET:  We do recommend a small meal at first, but then you may proceed to the Low [place] diet, please see the handout. .  Drink plenty of fluids but you should avoid alcoholic beverages for 24 hours.  ACTIVITY:  You should plan to take it easy for the rest of today and you should NOT DRIVE or use heavy machinery until tomorrow (because of the sedation medicines used during the test).    FOLLOW UP: Our staff will call the number listed on your records the next business day following your procedure to check on you and address any questions or concerns that you may have regarding the information given to you following your procedure. If we do not reach you, we will leave a message.  However, if you are feeling well and you are not experiencing any problems, there is no need to return our call.  We will assume that you have returned to your regular daily activities without incident.  If any biopsies were taken you will be contacted by phone or by letter within the next 1-3 weeks.  Please call us at [phone] if you have not heard about the biopsies in 3 weeks.    SIGNATURES/CONFIDENTIALITY: You and/or your care partner have signed paperwork which will be entered into your electronic medical record.  These signatures attest to the fact that that the information above on your After Visit Summary has been reviewed and is understood.  Full responsibility of the confidentiality of this discharge information lies with you and/or your care-partner.  Thank you for letting us take care of your healthcare needs today.

## 2017-06-21 NOTE — Progress Notes (Signed)
Called to room to assist during endoscopic procedure.  Patient ID and intended procedure confirmed with present staff. Received instructions for my participation in the procedure from the performing physician.  

## 2017-06-21 NOTE — Op Note (Addendum)
[place] Patient Name: Jason Brock Procedure Date: 06/21/2017 11:21 AM MRN: [id number] Endoscopist: Remo Lipps P. Havery Moros , MD Age: 52 Referring MD:  Date of Birth: 02/03/1965 Gender: Male Account #: [id number] Procedure:                Colonoscopy Indications:              Screening for colorectal malignant neoplasm,                            reported remote history of Crohn's disease on                            mesalamine Medicines:                Monitored Anesthesia Care Procedure:                Pre-Anesthesia Assessment:                           - Prior to the procedure, a History and Physical                            was performed, and patient medications and                            allergies were reviewed. The patient's tolerance of                            previous anesthesia was also reviewed. The risks                            and benefits of the procedure and the sedation                            options and risks were discussed with the patient.                            All questions were answered, and informed consent                            was obtained. Prior Anticoagulants: The patient has                            taken no previous anticoagulant or antiplatelet                            agents. ASA Grade Assessment: II - A patient with                            mild systemic disease. After reviewing the risks                            and benefits, the patient was deemed in  satisfactory condition to undergo the procedure.                           After obtaining informed consent, the colonoscope                            was passed under direct vision. Throughout the                            procedure, the patient's blood pressure, pulse, and                            oxygen saturations were monitored continuously. The                            Colonoscope was introduced through the anus and                          advanced to the the terminal ileum, with                            identification of the appendiceal orifice and IC                            valve. The colonoscopy was performed without                            difficulty. The patient tolerated the procedure                            well. The quality of the bowel preparation was                            good. The terminal ileum, ileocecal valve,                            appendiceal orifice, and rectum were photographed. Scope In: 11:37:24 AM Scope Out: 11:53:14 AM Scope Withdrawal Time: 0 hours 12 minutes 45 seconds  Total Procedure Duration: 0 hours 15 minutes 50 seconds  Findings:                 The perianal and digital rectal examinations were                            normal.                           The terminal ileum appeared normal.                           Two sessile polyps were found in the ascending                            colon. The polyps were 3 mm in size. These polyps  were removed with a cold snare. Resection and                            retrieval were complete.                           Internal hemorrhoids were found during retroflexion.                           The exam was otherwise without abnormality. No                            appreciable inflammation noted.                           Biopsies were taken with a cold forceps from the                            right, transverse, and left colon. These biopsy                            specimens were sent to Pathology. Complications:            No immediate complications. Estimated blood loss:                            Minimal. Estimated Blood Loss:     Estimated blood loss was minimal. Impression:               - The examined portion of the ileum was normal.                           - Two 3 mm polyps in the ascending colon, removed                            with a cold snare. Resected and  retrieved.                           - Internal hemorrhoids.                           - The examination was otherwise normal.                           - Biopsies for surveillance were taken from the                            entire colon for surveillance purposes. Recommendation:           - Patient has a contact number available for                            emergencies. The signs and symptoms of potential                            delayed complications were discussed with the  patient. Return to normal activities tomorrow.                            Written discharge instructions were provided to the                            patient.                           - Resume previous diet.                           - Continue present medications.                           - Await pathology results.                           - Repeat colonoscopy based on pathology results. Remo Lipps P. Jevaun Strick, MD 06/21/2017 11:57:52 AM This report has been signed electronically.

## 2017-06-22 ENCOUNTER — Telehealth: Payer: Self-pay | Admitting: *Deleted

## 2017-06-22 NOTE — Telephone Encounter (Signed)
6 Follow up Call-  Call back number 06/21/2017  Post procedure Call Back phone  # [phone]  Permission to leave phone message Yes  Some recent data might be hidden     Patient questions:  Do you have a fever, pain , or abdominal swelling? No. Pain Score  0 *  Have you tolerated food without any problems? Yes.    Have you been able to return to your normal activities? Yes.    Do you have any questions about your discharge instructions: Diet   No. Medications  No. Follow up visit  No.  Do you have questions or concerns about your Care? No.  Actions: * If pain score is 4 or above: No action needed, pain <4.

## 2017-07-02 ENCOUNTER — Encounter: Payer: Self-pay | Admitting: Gastroenterology

## 2017-07-12 ENCOUNTER — Encounter: Payer: Self-pay | Admitting: Family Medicine

## 2017-07-12 ENCOUNTER — Ambulatory Visit: Payer: 59 | Admitting: Family Medicine

## 2017-07-12 VITALS — BP 128/87 | HR 73 | Temp 98.3°F | Resp 16 | Ht 69.0 in | Wt 225.0 lb

## 2017-07-12 DIAGNOSIS — H1013 Acute atopic conjunctivitis, bilateral: Secondary | ICD-10-CM

## 2017-07-12 DIAGNOSIS — H0014 Chalazion left upper eyelid: Secondary | ICD-10-CM

## 2017-07-12 MED ORDER — POLYMYXIN B-TRIMETHOPRIM 10000-0.1 UNIT/ML-% OP SOLN: 2.0000 [drp] | OPHTHALMIC | 0 refills | Status: DC

## 2017-07-12 NOTE — Progress Notes (Signed)
Jason Brock is a 52 y.o. male who presents to [facility] today for right eye irritation.  Nivaan notes a 1 day history of eyelid swelling tenderness and eye redness.  He notes a pertinent history of repeated chalazion with several removals in the past.  Additionally he notes he has been having a several month history of mild eye irritation he attributes to allergic conjunctivitis.  He has not been using antihistamine eyedrops.  He denies any blurry vision.  He denies severe pain.  He notes he is been using warm compress this morning and attempt to get the chalazion to drain.  He notes some mild reddish-brownish discharge from his right eye.   ROS as above:  Exam:  BP 128/87   Pulse 73   Temp 98.3 F (36.8 C)   Resp 16   Ht 5' 9"  (1.753 m)   Wt 225 lb (102.1 kg)   SpO2 98%   BMI 33.23 kg/m  Gen: Well NAD HEENT: EOMI,  MMM left eye normal-appearing with normal motion. Right eye upper eyelid swollen and erythematous and mildly tender. Right lateral eye conjunctiva injection with a dilated vein at the right lateral upper eye.  No foreign bodies are visible. Range of motion of eyes are normal bilaterally. Pupillary responses equal normal bilaterally  Visual acuity 20/20 bilaterally  Lungs: Normal work of breathing. CTABL Heart: RRR no MRG Abd: NABS, Soft. Nondistended, Nontender Exts: Brisk capillary refill, warm and well perfused.      Assessment and Plan: 52 y.o. male with  Chalazion and conjunctivitis right eye.  Continue warm compress and use Polytrim eyedrops.  Additionally patient has allergic conjunctivitis history bilaterally.  Recommend Zaditor eyedrops along with oral antihistamines.  If not improving next step would be ophthalmology evaluation.  Warned about signs or symptoms including blurry vision.   No orders of the defined types were placed  in this encounter.  Meds ordered this encounter  Medications  . trimethoprim-polymyxin b (POLYTRIM) ophthalmic solution    Sig: Place 2 drops into the right eye every 4 (four) hours.    Dispense:  10 mL    Refill:  0     Historical information moved to improve visibility of documentation.  Past Medical History:  Diagnosis Date  . Allergy   . Asthma   . Moderate ulcerative colitis ([place]) 2009   Dr. Earle Gell colonoscopy report   No past surgical history on file. Social History   Tobacco Use  . Smoking status: Never Smoker  . Smokeless tobacco: Former Systems developer    Types: Snuff  Substance Use Topics  . Alcohol use: Yes    Alcohol/week: 0.0 - 6.0 oz   family history includes Cancer in his mother; Depression in his brother; Heart disease in his father.  Medications: Current Outpatient Medications  Medication Sig Dispense Refill  . acetaminophen (TYLENOL) 500 MG tablet Take 1,000 mg by mouth every 6 (six) hours as needed for moderate pain or headache.    . Albuterol Sulfate (PROAIR RESPICLICK) 825 (90 Base) MCG/ACT AEPB Inhale 1-2 puffs into the lungs every 4 (four) hours as needed (wheeze). 1 each 2  . AMBULATORY NON FORMULARY MEDICATION Continuous positive airway pressure (CPAP) titrate setting per protcal of H2O pressure, with all supplemental supplies as needed. 1 each 0  . Ascorbic Acid (VITAMIN C) 1000 MG tablet Take 2,000 mg by mouth daily.    Marland Kitchen atorvastatin (LIPITOR) 10 MG tablet Take  1 tablet (10 mg total) by mouth daily. 90 tablet 3  . azelastine (ASTELIN) 0.1 % nasal spray Place 2 sprays into both nostrils 2 (two) times daily. Use in each nostril as directed 30 mL 12  . Beclomethasone Dipropionate (QNASL) 80 MCG/ACT AERS Place 1 puff into the nose daily. 1 Inhaler 12  . Budesonide (RHINOCORT ALLERGY NA) Place 1 spray into the nose daily.     . Cholecalciferol (VITAMIN D3) 2000 units capsule Take 2,000 Units by mouth daily.    Marland Kitchen CLOBETASOL & CLOBETASOL EMUL EX Apply 1  application topically daily as needed (eczema).     . fluticasone furoate-vilanterol (BREO ELLIPTA) 100-25 MCG/INH AEPB Inhale 1 puff into the lungs daily. 60 each 12  . ibuprofen (ADVIL,MOTRIN) 200 MG tablet Take 400-800 mg by mouth every 6 (six) hours as needed (inflammation).    Marland Kitchen levocetirizine (XYZAL) 5 MG tablet TAKE 1 TABLET (5 MG TOTAL) BY MOUTH EVERY EVENING. (Patient taking differently: Take 5 mg by mouth daily. ) 90 tablet 2  . Multiple Vitamins-Minerals (MULTIVITAMIN ADULT PO) Take 1 tablet by mouth daily.     . Omega-3 Fatty Acids (FISH OIL) 1200 MG CAPS Take 2,400 mg by mouth 2 (two) times daily.    Marland Kitchen omeprazole (PRILOSEC) 40 MG capsule Take 1 capsule (40 mg total) by mouth daily. 90 capsule 1  . trimethoprim-polymyxin b (POLYTRIM) ophthalmic solution Place 2 drops into the right eye every 4 (four) hours. 10 mL 0   Current Facility-Administered Medications  Medication Dose Route Frequency Provider Last Rate Last Dose  . 0.9 %  sodium chloride infusion  500 mL Intravenous Once Armbruster, Carlota Raspberry, MD       No Known Allergies   Discussed warning signs or symptoms. Please see discharge instructions. Patient expresses understanding.

## 2017-07-12 NOTE — Patient Instructions (Addendum)
Thank you for coming in today. Use the antibiotic eye drops every 4-6 hours while awake for a few days.  Use over-the-counter Zaditor eyedrops (Ketotifen) twice daily as needed for itching and allergic conjunctivitis.  Use warm compress.  Return to go to eye doctor if not better or if worsen.   Chalazion A chalazion is a swelling or lump on the eyelid. It can affect the upper or lower eyelid. What are the causes? This condition may be caused by:  Long-lasting (chronic) inflammation of the eyelid glands.  A blocked oil gland in the eyelid.  What are the signs or symptoms? Symptoms of this condition include:  A swelling on the eyelid. The swelling may spread to areas around the eye.  A hard lump on the eyelid. This lump may make it hard to see out of the eye.  How is this diagnosed? This condition is diagnosed with an examination of the eye. How is this treated? This condition is treated by applying a warm compress to the eyelid. If the condition does not improve after two days, it may be treated with:  Surgery.  Medicine that is injected into the chalazion by a health care provider.  Medicine that is applied to the eye.  Follow these instructions at home:  Do not touch the chalazion.  Do not try to remove the pus, such as by squeezing the chalazion or sticking it with a pin or needle.  Do not rub your eyes.  Wash your hands often. Dry your hands with a clean towel.  Keep your face, scalp, and eyebrows clean.  Avoid wearing eye makeup.  Apply a warm, moist compress to the eyelid 4-6 times a day for 10-15 minutes at a time. This will help to open any blocked glands and help to reduce redness and swelling.  Apply over-the-counter and prescription medicines only as told by your health care provider.  If the chalazion does not break open (rupture) on its own in a month, return to your health care provider.  Keep all follow-up appointments as told by your health care  provider. This is important. Contact a health care provider if:  Your eyelid has not improved in 4 weeks.  Your eyelid is getting worse.  You have a fever.  The chalazion does not rupture on its own with home treatment in a month. Get help right away if:  You have pain in your eye.  Your vision changes.  The chalazion becomes painful or red  The chalazion gets bigger. This information is not intended to replace advice given to you by your health care provider. Make sure you discuss any questions you have with your health care provider. Document Released: 12/31/1999 Document Revised: 06/10/2015 Document Reviewed: 04/27/2014 Elsevier Interactive Patient Education  [employer].

## 2017-10-23 ENCOUNTER — Encounter: Payer: Self-pay | Admitting: Family Medicine

## 2017-10-23 ENCOUNTER — Ambulatory Visit: Payer: 59 | Admitting: Family Medicine

## 2017-10-23 VITALS — BP 126/78 | HR 75 | Ht 69.0 in | Wt 219.0 lb

## 2017-10-23 DIAGNOSIS — L739 Follicular disorder, unspecified: Secondary | ICD-10-CM | POA: Diagnosis not present

## 2017-10-23 DIAGNOSIS — Z23 Encounter for immunization: Secondary | ICD-10-CM | POA: Diagnosis not present

## 2017-10-23 MED ORDER — DOXYCYCLINE HYCLATE 100 MG PO TABS: 100.0000 mg | ORAL_TABLET | Freq: Two times a day (BID) | ORAL | 0 refills | Status: DC

## 2017-10-23 MED ORDER — MUPIROCIN 2 % EX OINT: TOPICAL_OINTMENT | CUTANEOUS | 3 refills | Status: DC

## 2017-10-23 NOTE — Progress Notes (Signed)
Jason Brock is a 52 y.o. male who presents to [facility] today for skin lesion on neck.   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 He says his lesions typically resolve on their own but he has a persisting lesion in the center of his neck. It is painful and he has picked at it and it returns and this cycle continues. He said recently he tried to cut it out with scissors and now he thinks it is infected. He says he has noticed some swollen lymph nodes in the back of his neck. He denies fevers or chills. He says that he cuts his own hair with clippers. He has not noticed any rubbing from his collar or other clothing.     ROS as above:  Exam:  BP 126/78   Pulse 75   Ht 5' 9"  (1.753 m)   Wt 219 lb (99.3 kg)   BMI 32.34 kg/m  Wt Readings from Last 5 Encounters:  10/23/17 219 lb (99.3 kg)  07/12/17 225 lb (102.1 kg)  06/21/17 226 lb (102.5 kg)  05/02/17 225 lb (102.1 kg)  04/17/17 226 lb (102.5 kg)    Gen: Well NAD HEENT: EOMI,  MMM Lungs: Normal work of breathing. CTABL Heart: RRR no MRG Abd: NABS, Soft. Nondistended, Nontender Exts: Brisk capillary refill, warm and well perfused.  Skin: crusted 1 cm lesion on posterior neck. Mildly erythematous. Tender to palpation.   Lab and Radiology Results No results found for this or any previous visit (from the past 72 hour(s)). No results found.    Assessment and Plan: 52 y.o. male with a skin lesion on the back of his neck likely folliculitis. He cuts his own hair and has likely been seeding infection in his skin with the clippers. His recurring lesion is likely because he interrupts the healing process himself. The plan will be to complete 7 days of doxycycline and also to use chlorhexidine body wash on the affected area as well as his arm pits and groin. He should recheck in 4 weeks  to see if improving. He should return sooner if symptoms are worsening or not improving. We discussed the possible need for excisional biopsy if not improved.   Received influenza vaccine today    Orders Placed This Encounter  Procedures  . Flu Vaccine QUAD 6+ mos PF IM (Fluarix Quad PF)   Meds ordered this encounter  Medications  . doxycycline (VIBRA-TABS) 100 MG tablet    Sig: Take 1 tablet (100 mg total) by mouth 2 (two) times daily.    Dispense:  14 tablet    Refill:  0  . mupirocin ointment (BACTROBAN) 2 %    Sig: Apply to affected area and to nose BID for 7 days    Dispense:  30 g    Refill:  3     Historical information moved to improve visibility of documentation.  Past Medical History:  Diagnosis Date  . Allergy   . Asthma   . Moderate ulcerative colitis ([place]) 2009   Dr. Earle Gell colonoscopy report   No past surgical history on file. Social History   Tobacco Use  . Smoking status: Never Smoker  . Smokeless tobacco: Former Systems developer    Types: Snuff  Substance Use Topics  . Alcohol use: Yes    Alcohol/week: 0.0 - 10.0 standard drinks   family history includes Cancer in his  mother; Depression in his brother; Heart disease in his father.  Medications: Current Outpatient Medications  Medication Sig Dispense Refill  . acetaminophen (TYLENOL) 500 MG tablet Take 1,000 mg by mouth every 6 (six) hours as needed for moderate pain or headache.    . Albuterol Sulfate (PROAIR RESPICLICK) 631 (90 Base) MCG/ACT AEPB Inhale 1-2 puffs into the lungs every 4 (four) hours as needed (wheeze). 1 each 2  . AMBULATORY NON FORMULARY MEDICATION Continuous positive airway pressure (CPAP) titrate setting per protcal of H2O pressure, with all supplemental supplies as needed. 1 each 0  . Ascorbic Acid (VITAMIN C) 1000 MG tablet Take 2,000 mg by mouth daily.    Marland Kitchen atorvastatin (LIPITOR) 10 MG tablet Take 1 tablet (10 mg total) by mouth daily. 90 tablet 3  . azelastine (ASTELIN) 0.1 %  nasal spray Place 2 sprays into both nostrils 2 (two) times daily. Use in each nostril as directed 30 mL 12  . Beclomethasone Dipropionate (QNASL) 80 MCG/ACT AERS Place 1 puff into the nose daily. 1 Inhaler 12  . Budesonide (RHINOCORT ALLERGY NA) Place 1 spray into the nose daily.     . Cholecalciferol (VITAMIN D3) 2000 units capsule Take 2,000 Units by mouth daily.    Marland Kitchen CLOBETASOL & CLOBETASOL EMUL EX Apply 1 application topically daily as needed (eczema).     . fluticasone furoate-vilanterol (BREO ELLIPTA) 100-25 MCG/INH AEPB Inhale 1 puff into the lungs daily. 60 each 12  . ibuprofen (ADVIL,MOTRIN) 200 MG tablet Take 400-800 mg by mouth every 6 (six) hours as needed (inflammation).    Marland Kitchen levocetirizine (XYZAL) 5 MG tablet TAKE 1 TABLET (5 MG TOTAL) BY MOUTH EVERY EVENING. (Patient taking differently: Take 5 mg by mouth daily. ) 90 tablet 2  . Multiple Vitamins-Minerals (MULTIVITAMIN ADULT PO) Take 1 tablet by mouth daily.     . Omega-3 Fatty Acids (FISH OIL) 1200 MG CAPS Take 2,400 mg by mouth 2 (two) times daily.    Marland Kitchen omeprazole (PRILOSEC) 40 MG capsule Take 1 capsule (40 mg total) by mouth daily. 90 capsule 1  . trimethoprim-polymyxin b (POLYTRIM) ophthalmic solution Place 2 drops into the right eye every 4 (four) hours. 10 mL 0  . doxycycline (VIBRA-TABS) 100 MG tablet Take 1 tablet (100 mg total) by mouth 2 (two) times daily. 14 tablet 0  . mupirocin ointment (BACTROBAN) 2 % Apply to affected area and to nose BID for 7 days 30 g 3   Current Facility-Administered Medications  Medication Dose Route Frequency Provider Last Rate Last Dose  . 0.9 %  sodium chloride infusion  500 mL Intravenous Once Armbruster, Carlota Raspberry, MD       No Known Allergies   Discussed warning signs or symptoms. Please see discharge instructions. Patient expresses understanding.   I personally was present and performed or re-performed the history, physical exam and medical decision-making activities of this service  and have verified that the service and findings are accurately documented in the student's note. ___________________________________________ Lynne Leader M.D., ABFM., CAQSM. Primary Care and Sports Medicine Adjunct Instructor of [place] [employer] [facility] [employer]

## 2017-10-23 NOTE — Patient Instructions (Signed)
Thank you for coming in today. Take the oral doxycycline twice daily for 1 week.  Use the ointment in the nose and on the wound for about 1 week.  Wash the scalp, arm pits and groin with Chlorhexidine body wash (Hibiclens) daily for 10 days or so then occasionally after.  Avoid shaving this area with things that make cause tiny cuts of razor burn.  Make sure to sterilize or clean clippers.   Recheck that skin lesion in 1 month when healed.  Take some pictures of it as well.    Folliculitis Folliculitis is inflammation of the hair follicles. Folliculitis most commonly occurs on the scalp, thighs, legs, back, and buttocks. However, it can occur anywhere on the body. What are the causes? This condition may be caused by:  A bacterial infection (common).  A fungal infection.  A viral infection.  Coming into contact with certain chemicals, especially oils and tars.  Shaving or waxing.  Applying greasy ointments or creams to your skin often.  Long-lasting folliculitis and folliculitis that keeps coming back can be caused by bacteria that live in the nostrils. What increases the risk? This condition is more likely to develop in people with:  A weakened immune system.  Diabetes.  Obesity.  What are the signs or symptoms? Symptoms of this condition include:  Redness.  Soreness.  Swelling.  Itching.  Small white or yellow, pus-filled, itchy spots (pustules) that appear over a reddened area. If there is an infection that goes deep into the follicle, these may develop into a boil (furuncle).  A group of closely packed boils (carbuncle). These tend to form in hairy, sweaty areas of the body.  How is this diagnosed? This condition is diagnosed with a skin exam. To find what is causing the condition, your health care provider may take a sample of one of the pustules or boils for testing. How is this treated? This condition may be treated by:  Applying warm compresses to the  affected areas.  Taking an antibiotic medicine or applying an antibiotic medicine to the skin.  Applying or bathing with an antiseptic solution.  Taking an over-the-counter medicine to help with itching.  Having a procedure to drain any pustules or boils. This may be done if a pustule or boil contains a lot of pus or fluid.  Laser hair removal. This may be done to treat long-lasting folliculitis.  Follow these instructions at home:  If directed, apply heat to the affected area as often as told by your health care provider. Use the heat source that your health care provider recommends, such as a moist heat pack or a heating pad. ? Place a towel between your skin and the heat source. ? Leave the heat on for 20-30 minutes. ? Remove the heat if your skin turns bright red. This is especially important if you are unable to feel pain, heat, or cold. You may have a greater risk of getting burned.  If you were prescribed an antibiotic medicine, use it as told by your health care provider. Do not stop using the antibiotic even if you start to feel better.  Take over-the-counter and prescription medicines only as told by your health care provider.  Do not shave irritated skin.  Keep all follow-up visits as told by your health care provider. This is important. Get help right away if:  You have more redness, swelling, or pain in the affected area.  Red streaks are spreading from the affected area.  You  have a fever. This information is not intended to replace advice given to you by your health care provider. Make sure you discuss any questions you have with your health care provider. Document Released: 03/13/2001 Document Revised: 07/23/2015 Document Reviewed: 10/23/2014 Elsevier Interactive Patient Education  2018 [employer].

## 2017-12-02 ENCOUNTER — Other Ambulatory Visit: Payer: Self-pay | Admitting: Gastroenterology

## 2018-02-21 ENCOUNTER — Encounter: Payer: Self-pay | Admitting: Family Medicine

## 2018-02-21 ENCOUNTER — Ambulatory Visit (INDEPENDENT_AMBULATORY_CARE_PROVIDER_SITE_OTHER): Payer: 59

## 2018-02-21 ENCOUNTER — Ambulatory Visit (INDEPENDENT_AMBULATORY_CARE_PROVIDER_SITE_OTHER): Payer: 59 | Admitting: Family Medicine

## 2018-02-21 VITALS — BP 116/79 | HR 80 | Ht 69.0 in | Wt 225.0 lb

## 2018-02-21 DIAGNOSIS — E785 Hyperlipidemia, unspecified: Secondary | ICD-10-CM | POA: Diagnosis not present

## 2018-02-21 DIAGNOSIS — Z125 Encounter for screening for malignant neoplasm of prostate: Secondary | ICD-10-CM

## 2018-02-21 DIAGNOSIS — M25551 Pain in right hip: Secondary | ICD-10-CM | POA: Diagnosis not present

## 2018-02-21 DIAGNOSIS — Z6833 Body mass index (BMI) 33.0-33.9, adult: Secondary | ICD-10-CM

## 2018-02-21 DIAGNOSIS — Z0000 Encounter for general adult medical examination without abnormal findings: Secondary | ICD-10-CM

## 2018-02-21 NOTE — Progress Notes (Signed)
Jason Brock is a 53 y.o. male who presents to [facility] today for well adult visit. Jason Brock is doing well.  Right hip pain: Jason Brock was found to have mild arthritis on x-ray in April 2019. He has not been doing any specific therapy or taking any medications for his hip. Over the past month or so, it has worsened. He says it is not necessarily painful, but that he's "noticing his hip more often". He feels it more when he is riding his bike. In October, he noticed a new pain to the right of his sacrum, that occurs mainly when he is leaning forward on his bike. 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 He wanted to see what he can do to slow the progression of the arthritis.   Dyslipidemia: Remains well-controlled on atorvastatin. No myalgias.  Health maintenance: Jason Brock rides his bike in the warm months about 3-4 days per week. He doesn't exercise as much in the colder months, but does walk sometimes. Drinks alcohol only a few times per month. Does not smoke. He is up to date on all relevant screening for his age.   ROS as above:  Past Medical History:  Diagnosis Date  . Allergy   . Asthma   . Moderate ulcerative colitis ([place]) 2009   Dr. Earle Gell colonoscopy report   No past surgical history on file. Social History   Tobacco Use  . Smoking status: Never Smoker  . Smokeless tobacco: Former Systems developer    Types: Snuff  Substance Use Topics  . Alcohol use: Yes    Alcohol/week: 0.0 - 10.0 standard drinks   family history includes Cancer in his mother; Depression in his brother; Heart disease in his father.  Medications: Current Outpatient Medications  Medication Sig Dispense Refill  . acetaminophen (TYLENOL) 500 MG tablet Take 1,000 mg by mouth every 6 (six) hours as needed for moderate pain or  headache.    . Albuterol Sulfate (PROAIR RESPICLICK) 737 (90 Base) MCG/ACT AEPB Inhale 1-2 puffs into the lungs every 4 (four) hours as needed (wheeze). 1 each 2  . AMBULATORY NON FORMULARY MEDICATION Continuous positive airway pressure (CPAP) titrate setting per protcal of H2O pressure, with all supplemental supplies as needed. 1 each 0  . Ascorbic Acid (VITAMIN C) 1000 MG tablet Take 2,000 mg by mouth daily.    Marland Kitchen atorvastatin (LIPITOR) 10 MG tablet Take 1 tablet (10 mg total) by mouth daily. 90 tablet 3  . azelastine (ASTELIN) 0.1 % nasal spray Place 2 sprays into both nostrils 2 (two) times daily. Use in each nostril as directed 30 mL 12  . Beclomethasone Dipropionate (QNASL) 80 MCG/ACT AERS Place 1 puff into the nose daily. 1 Inhaler 12  . Budesonide (RHINOCORT ALLERGY NA) Place 1 spray into the nose daily.     . Cholecalciferol (VITAMIN D3) 2000 units capsule Take 2,000 Units by mouth daily.    Marland Kitchen CLOBETASOL & CLOBETASOL EMUL EX Apply 1 application topically daily as needed (eczema).     . doxycycline (VIBRA-TABS) 100 MG tablet Take 1 tablet (100 mg total) by mouth 2 (two) times daily. 14 tablet 0  . fluticasone furoate-vilanterol (BREO ELLIPTA) 100-25 MCG/INH AEPB Inhale 1 puff into the lungs daily. 60 each 12  . ibuprofen (ADVIL,MOTRIN) 200 MG tablet Take 400-800 mg by mouth every 6 (six) hours as needed (  inflammation).    Marland Kitchen levocetirizine (XYZAL) 5 MG tablet TAKE 1 TABLET (5 MG TOTAL) BY MOUTH EVERY EVENING. (Patient taking differently: Take 5 mg by mouth daily. ) 90 tablet 2  . Multiple Vitamins-Minerals (MULTIVITAMIN ADULT PO) Take 1 tablet by mouth daily.     . mupirocin ointment (BACTROBAN) 2 % Apply to affected area and to nose BID for 7 days 30 g 3  . Omega-3 Fatty Acids (FISH OIL) 1200 MG CAPS Take 2,400 mg by mouth 2 (two) times daily.    Marland Kitchen omeprazole (PRILOSEC) 40 MG capsule TAKE 1 CAPSULE BY MOUTH EVERY DAY 30 capsule 5  . trimethoprim-polymyxin b (POLYTRIM) ophthalmic solution  Place 2 drops into the right eye every 4 (four) hours. 10 mL 0   Current Facility-Administered Medications  Medication Dose Route Frequency Provider Last Rate Last Dose  . 0.9 %  sodium chloride infusion  500 mL Intravenous Once Armbruster, Carlota Raspberry, MD       No Known Allergies  Health Maintenance Health Maintenance  Topic Date Due  . COLONOSCOPY  06/22/2022  . TETANUS/TDAP  03/21/2026  . INFLUENZA VACCINE  Completed  . HIV Screening  Completed     Exam:  BP 116/79   Pulse 80   Ht 5' 9"  (1.753 m)   Wt 225 lb (102.1 kg)   BMI 33.23 kg/m  Wt Readings from Last 5 Encounters:  02/21/18 225 lb (102.1 kg)  10/23/17 219 lb (99.3 kg)  07/12/17 225 lb (102.1 kg)  06/21/17 226 lb (102.5 kg)  05/02/17 225 lb (102.1 kg)    Gen: Well NAD HEENT: EOMI,  MMM Lungs: Normal work of breathing. CTABL Heart: RRR no MRG Abd: NABS, Soft. Nondistended, Nontender Exts: Brisk capillary refill, warm and well perfused.  Psych: Normal affect.  MSK: Right hip: Normal appearance. Mildly tender to palpation over ASIS.  Full ROM. Pain elicited with hip flexion. Otherwise no pain with hip ROM. Strength 5/5 with resisted hip abduction, adduction, flexion, and extension.  Slight pain with internal rotation. No pain with external rotation.  Pulses and capillary refill normal.  Left hip: Normal appearance. No tenderness to palpation.  Full ROM without pain.  Strength 5/5 with resisted hip abduction, adduction, flexion, and extension.  No pain with internal or external rotation.  Pulses and capillary refill normal.    Depression screen [facility] 2/9 02/21/2018 02/21/2017 03/20/2016  Decreased Interest 0 0 0  Down, Depressed, Hopeless 0 0 0  PHQ - 2 Score 0 0 0  Altered sleeping - 0 -  Tired, decreased energy - 0 -  Change in appetite - 0 -  Feeling bad or failure about yourself  - 0 -  Trouble concentrating - 0 -  Moving slowly or fidgety/restless - 0 -  Suicidal thoughts - 0 -  PHQ-9 Score - 0 -       Lab and Radiology Results  Right hip x-ray 02/21/18:  Images personally independently reviewed.  No significant change in appearance from x-ray April 2019.  Mild degenerative changes present. Await formal radiology review  Right hip x-ray 05/02/17: CLINICAL DATA:  Right groin pain for 5 months, no injury  EXAM: DG HIP (WITH OR WITHOUT PELVIS) 2-3V RIGHT  COMPARISON:  None.  FINDINGS: There is very mild degenerative joint disease of the hips with some superior acetabular spurring present but well preserved joint spaces are noted. No fracture is seen. The pelvic rami are intact. The SI joints are corticated.  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 No acute abnormality.   Electronically Signed   By: Ivar Drape M.D.   On: 05/02/2017 15:15 I personally (independently) visualized and performed the interpretation of the images attached in this note.   Assessment and Plan: 53 y.o. male with  Adult visit.  Doing reasonably well.  Health maintenance up-to-date.  Patient exercising some and trying to eat a careful diet.  He takes medications regularly.  Risk factors discussed.  And for basic fasting labs listed below.  Right hip pain: Jason Brock has had a mild worsening of the pain of his right hip due to arthritis. X-ray today did not show any changes in his hip from his last x-ray in April 2019. No signs of worsening disease. Attend PT for hip for 6 weeks. Follow-up with me in six weeks. If no improvement at that time, will do MRI arthrogram with steroid injection.   Dyslipidemia: Continue atorvastatin. Follow-up on lipid panel labs.   Health maintenance: Follow-up on CBC and CMP.   PDMP reviewed during this encounter. Orders Placed This Encounter  Procedures  . DG HIP UNILAT WITH PELVIS 2-3 VIEWS RIGHT    Standing Status:   Future    Number of Occurrences:   1    Standing Expiration Date:   04/22/2019    Order Specific Question:   Reason for  Exam (SYMPTOM  OR DIAGNOSIS REQUIRED)    Answer:   eval right hip pain ? FAI vs DJD    Order Specific Question:   Preferred imaging location?    Answer:   [facility]    Order Specific Question:   Radiology Contrast Protocol - do NOT remove file path    Answer:   [email or web address]  . CBC  . COMPLETE METABOLIC PANEL WITH GFR  . Lipid Panel w/reflex Direct LDL  . PSA  . Ambulatory referral to Physical Therapy    Referral Priority:   Routine    Referral Type:   Physical Medicine    Referral Reason:   Specialty Services Required    Requested Specialty:   Physical Therapy   No orders of the defined types were placed in this encounter.    Discussed warning signs or symptoms. Please see discharge instructions. Patient expresses understanding.  I personally was present and performed or re-performed the history, physical exam and medical decision-making activities of this service and have verified that the service and findings are accurately documented in the student's note. ___________________________________________ Lynne Leader M.D., ABFM., CAQSM. Primary Care and Sports Medicine Adjunct Instructor of [place] [employer] [facility] [employer]

## 2018-02-21 NOTE — Patient Instructions (Addendum)
Thank you for coming in today. Attend PT.  Let me know how you are doing especially after a 6 week course.  If not improving next step is MRI arthrogram with steroid injection.   Keep me updated.   Get labs now.

## 2018-02-22 LAB — CBC
HCT: 43.7 % (ref 38.5–50.0)
Hemoglobin: 15.1 g/dL (ref 13.2–17.1)
MCH: 29.2 pg (ref 27.0–33.0)
MCHC: 34.6 g/dL (ref 32.0–36.0)
MCV: 84.4 fL (ref 80.0–100.0)
MPV: 11.8 fL (ref 7.5–12.5)
Platelets: 291 10*3/uL (ref 140–400)
RBC: 5.18 10*6/uL (ref 4.20–5.80)
RDW: 13.1 % (ref 11.0–15.0)
WBC: 8.4 10*3/uL (ref 3.8–10.8)

## 2018-02-22 LAB — LIPID PANEL W/REFLEX DIRECT LDL
Cholesterol: 161 mg/dL (ref <200)
HDL: 30 mg/dL — ABNORMAL LOW (ref >=40)
LDL Cholesterol (Calc): 89 mg/dL (calc)
Non-HDL Cholesterol (Calc): 131 mg/dL (calc) — ABNORMAL HIGH (ref <130)
Total CHOL/HDL Ratio: 5.4 (calc) — ABNORMAL HIGH (ref <5.0)
Triglycerides: 318 mg/dL — ABNORMAL HIGH (ref <150)

## 2018-02-22 LAB — COMPLETE METABOLIC PANEL WITH GFR
AG Ratio: 1.6 (calc) (ref 1.0–2.5)
ALT: 34 U/L (ref 9–46)
AST: 23 U/L (ref 10–35)
Albumin: 4.2 g/dL (ref 3.6–5.1)
Alkaline phosphatase (APISO): 55 U/L (ref 35–144)
BUN: 18 mg/dL (ref 7–25)
CO2: 28 mmol/L (ref 20–32)
Calcium: 9.3 mg/dL (ref 8.6–10.3)
Chloride: 104 mmol/L (ref 98–110)
Creat: 1.08 mg/dL (ref 0.70–1.33)
GFR, Est African American: 92 mL/min/{1.73_m2} (ref >=60)
GFR, Est Non African American: 79 mL/min/{1.73_m2} (ref >=60)
Globulin: 2.7 g/dL (calc) (ref 1.9–3.7)
Glucose, Bld: 97 mg/dL (ref 65–99)
Potassium: 4.5 mmol/L (ref 3.5–5.3)
Sodium: 140 mmol/L (ref 135–146)
Total Bilirubin: 0.7 mg/dL (ref 0.2–1.2)
Total Protein: 6.9 g/dL (ref 6.1–8.1)

## 2018-02-22 LAB — PSA: PSA: 0.6 ng/mL (ref <=4.0)

## 2018-03-01 ENCOUNTER — Encounter: Payer: Self-pay | Admitting: Rehabilitative and Restorative Service Providers"

## 2018-03-01 ENCOUNTER — Ambulatory Visit: Payer: 59 | Admitting: Rehabilitative and Restorative Service Providers"

## 2018-03-01 ENCOUNTER — Other Ambulatory Visit: Payer: Self-pay

## 2018-03-01 DIAGNOSIS — M25551 Pain in right hip: Secondary | ICD-10-CM | POA: Diagnosis not present

## 2018-03-01 DIAGNOSIS — R29898 Other symptoms and signs involving the musculoskeletal system: Secondary | ICD-10-CM

## 2018-03-01 NOTE — Therapy (Signed)
[facility] [place] [facility] [place], [place], [address] Phone: [phone]   Fax:  [phone]  Physical Therapy Evaluation  Patient Details  Name: Jason Brock MRN: [id number] Date of Birth: Nov 12, 1965 Referring Provider (PT): Dr Lynne Leader    Encounter Date: 03/01/2018  PT End of Session - 03/01/18 1320    Visit Number  1    Number of Visits  6    Date for PT Re-Evaluation  04/12/18    PT Start Time  4854    PT Stop Time  1100    PT Time Calculation (min)  45 min    Activity Tolerance  Patient tolerated treatment well       Past Medical History:  Diagnosis Date  . Allergy   . Asthma   . Moderate ulcerative colitis ([place]) 2009   Dr. Earle Gell colonoscopy report    History reviewed. No pertinent surgical history.  There were no vitals filed for this visit.   Subjective Assessment - 03/01/18 1026    Subjective  Patient reports gradual onset of Rt hip pain since 12/18 with no known injury. He bikes on a regular basis. Pain is with Rt hip flexion     Pertinent History  LBP with prolonged standing - relieved with movement; arthritis    Diagnostic tests  xrays     Patient Stated Goals  get rid of the pain and put pants on without hurting     Currently in Pain?  Yes    Pain Score  0-No pain   4-5/10 with movement    Pain Location  Hip    Pain Orientation  Right    Pain Descriptors / Indicators  Sharp    Pain Type  Chronic pain    Pain Onset  More than a month ago    Pain Frequency  Intermittent    Aggravating Factors   with putting on pants; lifting Rt LE to get in the vehicle; stepping up for higher surfaces - lifting leg     Pain Relieving Factors  avoiding activities that irritate symptoms          [facility] PT Assessment - 03/01/18 0001      Assessment   Medical Diagnosis  Rt hip pain     Referring Provider (PT)  Dr Lynne Leader     Onset Date/Surgical Date  12/30/16    Hand Dominance  Right    Next  MD Visit  to schedule post 6 wk of PT     Prior Therapy  none       Precautions   Precautions  None      Balance Screen   Has the patient fallen in the past 6 months  No    Has the patient had a decrease in activity level because of a fear of falling?   No    Is the patient reluctant to leave their home because of a fear of falling?   No      Prior Function   Level of Independence  Independent    Vocation  Full time employment    Stage manager - car/desk/some walking     Leisure  yard work; household chores; 1-3 days/wk mountain bike from 45-120 min       Observation/Other Assessments   Focus on Therapeutic Outcomes (FOTO)   29% limitation       Sensation   Additional Comments  WFL's per pt report  Posture/Postural Control   Posture Comments  head forward; shoudlers rounded; flexed forward at hips       AROM   Right/Left Hip  --   tight end ranges throughout Rt > Lt very tight hip flexors    Lumbar Flexion  70%    Lumbar Extension  60%    Lumbar - Right Side Bend  75%    Lumbar - Left Side Bend  75%    Lumbar - Right Rotation  55%    Lumbar - Left Rotation  50%       Strength   Overall Strength Comments  5/5 bilat LE's       Flexibility   Hamstrings  tight Rt > Lt ~ 50-55 deg     Quadriceps  tight rt > Lt     ITB  tight Rt > Lt     Piriformis  tight Rt > Lt       Palpation   Spinal mobility  tenderness Rt lateral sacrum     Palpation comment  tight Rt psoas and hip flexors; tight posterior hip ar lateral sacral border point tender        Ambulation/Gait   Gait Comments  wde based gait LE's ER                 Objective measurements completed on examination: See above findings.      [facility] Adult PT Treatment/Exercise - 03/01/18 0001      Therapeutic Activites    Therapeutic Activities  --   myofacial ball release standing and prone - hip flexors      Lumbar Exercises: Stretches   Passive Hamstring Stretch  Right;2  reps;30 seconds   supine with strap    Hip Flexor Stretch  Right;2 reps;30 seconds   supine knees to chest then ext Rt LE    Hip Flexor Stretch Limitations  seated 2 reps x 30 sec     Press Ups  10 reps   2-3 sec hold    Quad Stretch  Right;2 reps;30 seconds    ITB Stretch  Right;2 reps;30 seconds    Piriformis Stretch  Right;2 reps;30 seconds   supine travell                  PT Long Term Goals - 03/01/18 1529      PT LONG TERM GOAL #1   Title  Improve tissue extensibility and ROM through LE's with patient to demonstrate increased mobility 04/13/2018    Time  6    Period  Weeks    Status  New      PT LONG TERM GOAL #2   Title  Decreaesd pain by 50-75% with hip flexion 04/13/2018    Time  6    Period  Weeks    Status  New      PT LONG TERM GOAL #3   Title  Independent in HEP 04/13/2018    Time  6    Period  Weeks    Status  New      PT LONG TERM GOAL #4   Title  Decrease FOTO to </= 27% limitation 04/13/2018     Time  6    Period  Weeks    Status  New             Plan - 03/01/18 1325    Clinical Impression Statement  Patient presents with anterior Rt hip pain which has been present for several months. Patient  has pain with activities requiring Rt hip flexion. He has decreased ROM/mobility through trunk and bilat LE's; muscular tightness through the Rt hip flexors. Patient will benefit from PT to address problems identified.     History and Personal Factors relevant to plan of care:  chronic nature of pain     Clinical Presentation  Stable    Clinical Decision Making  Low    Rehab Potential  Good    PT Frequency  1x / week    PT Duration  6 weeks    PT Treatment/Interventions  Patient/family education;ADLs/Self Care Home Management;Cryotherapy;Electrical Stimulation;Iontophoresis 24m/ml Dexamethasone;Moist Heat;Ultrasound;Dry needling;Manual techniques;Neuromuscular re-education;Functional mobility training;Therapeutic activities;Therapeutic exercise     PT Next Visit Plan  review HEP; progress with stretching; myofacial ball work; dee tissue work to address tightness Rt hip flexors modalities as indicated     Consulted and Agree with Plan of Care  Patient       Patient will benefit from skilled therapeutic intervention in order to improve the following deficits and impairments:  Postural dysfunction, Improper body mechanics, Pain, Increased fascial restricitons, Increased muscle spasms, Hypomobility, Decreased range of motion, Decreased mobility, Decreased activity tolerance  Visit Diagnosis: Pain in right hip - Plan: PT plan of care cert/re-cert  Other symptoms and signs involving the musculoskeletal system - Plan: PT plan of care cert/re-cert     Problem List Patient Active Problem List   Diagnosis Date Noted  . Allergic rhinitis 06/09/2016  . Colitis 03/20/2016  . Ganglion cyst 03/20/2016  . Dyslipidemia 12/24/2014  . Vitamin D deficiency 12/24/2014  . Sleep apnea 12/22/2014  . Asthma, allergic 12/22/2014  . Dysphagia 12/22/2014    Carlota Philley PNilda SimmerPT, MPH  03/01/2018, 4:12 PM  [facility] [address] [address]Phone: [phone]  Fax:  [phone] Name: MShafer SwamyMRN: [id number] of Birth: [birth year]

## 2018-03-05 ENCOUNTER — Other Ambulatory Visit: Payer: Self-pay | Admitting: Family Medicine

## 2018-03-05 DIAGNOSIS — J45909 Unspecified asthma, uncomplicated: Secondary | ICD-10-CM

## 2018-03-11 ENCOUNTER — Ambulatory Visit: Payer: 59 | Admitting: Rehabilitative and Restorative Service Providers"

## 2018-03-11 ENCOUNTER — Encounter: Payer: Self-pay | Admitting: Rehabilitative and Restorative Service Providers"

## 2018-03-11 DIAGNOSIS — R29898 Other symptoms and signs involving the musculoskeletal system: Secondary | ICD-10-CM | POA: Diagnosis not present

## 2018-03-11 DIAGNOSIS — M25551 Pain in right hip: Secondary | ICD-10-CM | POA: Diagnosis not present

## 2018-03-11 NOTE — Therapy (Signed)
Jason Brock, [place], [address] Phone: [phone]   Fax:  [phone]  Physical Therapy Treatment  Patient Details  Name: Jason Brock MRN: [id number] Date of Birth: 11-13-1965 Referring Provider (PT): Dr Jason Brock    Encounter Date: 03/11/2018  PT End of Session - 03/11/18 1059    Visit Number  2    Number of Visits  6    Date for PT Re-Evaluation  04/12/18    PT Start Time  1100    PT Stop Time  1200    PT Time Calculation (min)  60 min    Activity Tolerance  Patient tolerated treatment well       Past Medical History:  Diagnosis Date  . Allergy   . Asthma   . Moderate ulcerative colitis ([place]) 2009   Dr. Earle Brock colonoscopy report    History reviewed. No pertinent surgical history.  There were no vitals filed for this visit.  Subjective Assessment - 03/11/18 1100    Subjective  Patient reports that he has been doing the stretches consistently. He feels he has increased flexibility and sometimes thinks he has had some improvement in the hip and other times not so much.     Currently in Pain?  No/denies                       [facility] Adult PT Treatment/Exercise - 03/11/18 0001      Lumbar Exercises: Stretches   Passive Hamstring Stretch  Right;2 reps;30 seconds   supine with strap    Hip Flexor Stretch  Right;2 reps;30 seconds   supine knees to chest then ext Rt LE    Hip Flexor Stretch Limitations  1/2 kneeling 30-45 sec x 3 reps     Press Ups  10 reps   2-3 sec hold/started w/partial press up to dec mid back pain   Quad Stretch  Right;2 reps;30 seconds   added 4 inc foam roll at distal thigh    ITB Stretch  Right;2 reps;30 seconds    Piriformis Stretch  Right;2 reps;30 seconds   supine travell      Moist Heat Therapy   Number Minutes Moist Heat  15 Minutes    Moist Heat Location  Lumbar Spine;Hip   anterior Rt hip      Electrical Stimulation   Electrical Stimulation Location  anterior hip     Electrical Stimulation Action  TENS     Electrical Stimulation Parameters  to tolerance     Electrical Stimulation Goals  Pain;Tone      Manual Therapy   Manual therapy comments  pt supine in hooklying position     Soft tissue mobilization  deep tissue work through Jason Brock anterior hip              PT Education - 03/11/18 1155    Education Details  HEP TENs DN     Person(s) Educated  Patient    Methods  Explanation;Demonstration;Tactile cues;Verbal cues;Handout    Comprehension  Verbalized understanding;Returned demonstration;Verbal cues required;Tactile cues required          PT Long Term Goals - 03/01/18 1529      PT LONG TERM GOAL #1   Title  Improve tissue extensibility and ROM through LE's with patient to demonstrate increased mobility 04/13/2018    Time  6    Period  Weeks    Status  New  PT LONG TERM GOAL #2   Title  Decreaesd pain by 50-75% with hip flexion 04/13/2018    Time  6    Period  Weeks    Status  New      PT LONG TERM GOAL #3   Title  Independent in HEP 04/13/2018    Time  6    Period  Weeks    Status  New      PT LONG TERM GOAL #4   Title  Decrease FOTO to </= 27% limitation 04/13/2018     Time  6    Period  Weeks    Status  New            Plan - 03/11/18 1100    Clinical Impression Statement  Patient reports that he may feel "a little" bit better with the stretches. Reviewed HEP and modified/added exercises without difficulty. Trial of deep tissue work through hip flexors. Trial of estim and moist heat Rt anterior hip.     Rehab Potential  Good    PT Frequency  1x / week    PT Duration  6 weeks    PT Treatment/Interventions  Patient/family education;ADLs/Self Care Home Management;Cryotherapy;Electrical Stimulation;Iontophoresis 34m/ml Dexamethasone;Moist Heat;Ultrasound;Dry needling;Manual techniques;Neuromuscular re-education;Functional mobility training;Therapeutic  activities;Therapeutic exercise    PT Next Visit Plan  review HEP; progress with stretching; myofacial ball work; dee tissue work to address tightness Rt hip flexors modalities as indicated     Consulted and Agree with Plan of Care  Patient       Patient will benefit from skilled therapeutic intervention in order to improve the following deficits and impairments:  Postural dysfunction, Improper body mechanics, Pain, Increased fascial restricitons, Increased muscle spasms, Hypomobility, Decreased range of motion, Decreased mobility, Decreased activity tolerance  Visit Diagnosis: Pain in right hip  Other symptoms and signs involving the musculoskeletal system     Problem List Patient Active Problem List   Diagnosis Date Noted  . Allergic rhinitis 06/09/2016  . Colitis 03/20/2016  . Ganglion cyst 03/20/2016  . Dyslipidemia 12/24/2014  . Vitamin D deficiency 12/24/2014  . Sleep apnea 12/22/2014  . Asthma, allergic 12/22/2014  . Dysphagia 12/22/2014    Jason Brock PNilda SimmerPT, MPH  03/11/2018, 12:13 PM  [facility] [place] [address]Phone: [phone]  Fax:  [phone] Name: Jason WalshMRN: [id number] of Birth: 103-25-1967

## 2018-03-11 NOTE — Patient Instructions (Addendum)
Trigger Point Dry Needling  . What is Trigger Point Dry Needling (DN)? o DN is a physical therapy technique used to treat muscle pain and dysfunction. Specifically, DN helps deactivate muscle trigger points (muscle knots).  o A thin filiform needle is used to penetrate the skin and stimulate the underlying trigger point. The goal is for a local twitch response (LTR) to occur and for the trigger point to relax. No medication of any kind is injected during the procedure.   . What Does Trigger Point Dry Needling Feel Like?  o The procedure feels different for each individual patient. Some patients report that they do not actually feel the needle enter the skin and overall the process is not painful. Very mild bleeding may occur. However, many patients feel a deep cramping in the muscle in which the needle was inserted. This is the local twitch response.   Marland Kitchen How Will I feel after the treatment? o Soreness is normal, and the onset of soreness may not occur for a few hours. Typically this soreness does not last longer than two days.  o Bruising is uncommon, however; ice can be used to decrease any possible bruising.  o In rare cases feeling tired or nauseous after the treatment is normal. In addition, your symptoms may get worse before they get better, this period will typically not last longer than 24 hours.   . What Can I do After My Treatment? o Increase your hydration by drinking more water for the next 24 hours. o You may place ice or heat on the areas treated that have become sore, however, do not use heat on inflamed or bruised areas. Heat often brings more relief post needling. o You can continue your regular activities, but vigorous activity is not recommended initially after the treatment for 24 hours. o DN is best combined with other physical therapy such as strengthening, stretching, and other therapies.     Access Code: [email or web address]  URL: [email or web address]  Date:  03/11/2018  Prepared by: Gillermo Murdoch   Exercises  Half Kneeling Hip Flexor Stretch - 3 reps - 1 sets - 30 sec hold - 2x daily - 7x weekly  Patient Education  TENS Unit

## 2018-03-20 ENCOUNTER — Ambulatory Visit (INDEPENDENT_AMBULATORY_CARE_PROVIDER_SITE_OTHER): Payer: 59

## 2018-03-20 ENCOUNTER — Encounter: Payer: Self-pay | Admitting: Family Medicine

## 2018-03-20 ENCOUNTER — Ambulatory Visit: Payer: 59 | Admitting: Family Medicine

## 2018-03-20 VITALS — BP 130/79 | HR 71 | Wt 223.0 lb

## 2018-03-20 DIAGNOSIS — J45909 Unspecified asthma, uncomplicated: Secondary | ICD-10-CM | POA: Diagnosis not present

## 2018-03-20 DIAGNOSIS — R079 Chest pain, unspecified: Secondary | ICD-10-CM

## 2018-03-20 MED ORDER — ALBUTEROL SULFATE HFA 108 (90 BASE) MCG/ACT IN AERS: 2.0000 | INHALATION_SPRAY | Freq: Four times a day (QID) | RESPIRATORY_TRACT | 2 refills | Status: DC | PRN

## 2018-03-20 MED ORDER — FLUTICASONE FUROATE-VILANTEROL 200-25 MCG/INH IN AEPB: 1.0000 | INHALATION_SPRAY | Freq: Every day | RESPIRATORY_TRACT | 12 refills | Status: DC

## 2018-03-20 NOTE — Progress Notes (Signed)
Jason Brock is a 53 y.o. male who presents to [facility] today for possible aspiration.   Jason Brock was eating at [employer] on Thursday, February 27.  He aspirated a bit of food and then immediately started coughing.  He does not think he got the food up completely he has had some left-sided mild chest pressure since.  He is tried coughing several times and is unable to get anything up.  He notes a bit of wheezing but denies shortness of breath fevers or chills.  He denies a productive cough.  He has a pertinent medical history for asthma.  He uses Breo 100 daily.   ROS as above:  Exam:  BP 130/79   Pulse 71   Wt 223 lb (101.2 kg)   SpO2 99%   BMI 32.93 kg/m  Wt Readings from Last 5 Encounters:  03/20/18 223 lb (101.2 kg)  02/21/18 225 lb (102.1 kg)  10/23/17 219 lb (99.3 kg)  07/12/17 225 lb (102.1 kg)  06/21/17 226 lb (102.5 kg)    Gen: Well NAD HEENT: EOMI,  MMM Lungs: Normal work of breathing.  Wheezing and slight prolonged expiratory phase present bilaterally Heart: RRR no MRG Abd: NABS, Soft. Nondistended, Nontender Exts: Brisk capillary refill, warm and well perfused.   Lab and Radiology Results Two-view chest x-ray images show bronchitic changes without acute infiltrate.  Images personally independently reviewed. Await formal radiology review   Assessment and Plan: 53 y.o. male with  Wheezing following possible aspiration event about a week ago.  Patient does not have clear evidence of aspiration pneumonia exam or x-ray.  Discussed treatment options.  Plan for a bit of watchful waiting.  We will increase Breo dose for a month or 2.  Will use albuterol as well.  PDMP not reviewed this encounter. Orders Placed This Encounter  Procedures  . DG Chest 2 View    Order Specific Question:   Reason for exam:    Answer:   Cough, assess intra-thoracic  pathology    Order Specific Question:   Preferred imaging location?    Answer:   [facility]   Meds ordered this encounter  Medications  . fluticasone furoate-vilanterol (BREO ELLIPTA) 200-25 MCG/INH AEPB    Sig: Inhale 1 puff into the lungs daily.    Dispense:  30 each    Refill:  12  . albuterol (PROVENTIL HFA;VENTOLIN HFA) 108 (90 Base) MCG/ACT inhaler    Sig: Inhale 2 puffs into the lungs every 6 (six) hours as needed for wheezing or shortness of breath.    Dispense:  1 Inhaler    Refill:  2     Historical information moved to improve visibility of documentation.  Past Medical History:  Diagnosis Date  . Allergy   . Asthma   . Moderate ulcerative colitis ([place]) 2009   Dr. Earle Gell colonoscopy report   No past surgical history on file. Social History   Tobacco Use  . Smoking status: Never Smoker  . Smokeless tobacco: Former Systems developer    Types: Snuff  Substance Use Topics  . Alcohol use: Yes    Alcohol/week: 0.0 - 10.0 standard drinks   family history includes Cancer in his mother; Depression in his brother; Heart disease in his father.  Medications: Current Outpatient Medications  Medication Sig Dispense Refill  . acetaminophen (TYLENOL) 500 MG tablet Take 1,000 mg by mouth every 6 (six) hours as needed for moderate pain or  headache.    . AMBULATORY NON FORMULARY MEDICATION Continuous positive airway pressure (CPAP) titrate setting per protcal of H2O pressure, with all supplemental supplies as needed. 1 each 0  . Ascorbic Acid (VITAMIN C) 1000 MG tablet Take 2,000 mg by mouth daily.    Marland Kitchen atorvastatin (LIPITOR) 10 MG tablet TAKE 1 TABLET BY MOUTH EVERY DAY 30 tablet 11  . azelastine (ASTELIN) 0.1 % nasal spray Place 2 sprays into both nostrils 2 (two) times daily. Use in each nostril as directed 30 mL 12  . Beclomethasone Dipropionate (QNASL) 80 MCG/ACT AERS Place 1 puff into the nose daily. 1 Inhaler 12  . Budesonide (RHINOCORT ALLERGY NA) Place 1 spray  into the nose daily.     . Cholecalciferol (VITAMIN D3) 2000 units capsule Take 2,000 Units by mouth daily.    Marland Kitchen CLOBETASOL & CLOBETASOL EMUL EX Apply 1 application topically daily as needed (eczema).     Marland Kitchen ibuprofen (ADVIL,MOTRIN) 200 MG tablet Take 400-800 mg by mouth every 6 (six) hours as needed (inflammation).    Marland Kitchen levocetirizine (XYZAL) 5 MG tablet TAKE 1 TABLET (5 MG TOTAL) BY MOUTH EVERY EVENING. (Patient taking differently: Take 5 mg by mouth daily. ) 90 tablet 2  . Multiple Vitamins-Minerals (MULTIVITAMIN ADULT PO) Take 1 tablet by mouth daily.     . mupirocin ointment (BACTROBAN) 2 % Apply to affected area and to nose BID for 7 days 30 g 3  . Omega-3 Fatty Acids (FISH OIL) 1200 MG CAPS Take 2,400 mg by mouth 2 (two) times daily.    Marland Kitchen omeprazole (PRILOSEC) 40 MG capsule TAKE 1 CAPSULE BY MOUTH EVERY DAY 30 capsule 5  . trimethoprim-polymyxin b (POLYTRIM) ophthalmic solution Place 2 drops into the right eye every 4 (four) hours. 10 mL 0  . albuterol (PROVENTIL HFA;VENTOLIN HFA) 108 (90 Base) MCG/ACT inhaler Inhale 2 puffs into the lungs every 6 (six) hours as needed for wheezing or shortness of breath. 1 Inhaler 2  . fluticasone furoate-vilanterol (BREO ELLIPTA) 200-25 MCG/INH AEPB Inhale 1 puff into the lungs daily. 30 each 12   Current Facility-Administered Medications  Medication Dose Route Frequency Provider Last Rate Last Dose  . 0.9 %  sodium chloride infusion  500 mL Intravenous Once Armbruster, Carlota Raspberry, MD       No Known Allergies   Discussed warning signs or symptoms. Please see discharge instructions. Patient expresses understanding.

## 2018-03-20 NOTE — Patient Instructions (Addendum)
Thank you for coming in today. Increase breo to 252m inhailler for 1-3 months.  When feeling better ok to go back to the 1068minahiller.  Let me know and I will send in the 10037mose.  Consider Muccinex DM 1200/60 as needed.   Keep me updated.   I will get the xray result to you asap.   Aspiration Pneumonia Aspiration pneumonia is an infection in the lungs. It occurs when saliva or liquid contaminated with bacteria is inhaled (aspirated) into the lungs. When these things get into the lungs, swelling (inflammation) and infection can occur. This can make it difficult to breathe. Aspiration pneumonia is a serious condition and can be life threatening. What are the causes? This condition is caused when saliva or liquid from the mouth, throat, or stomach is inhaled into the lungs, and when those fluids are contaminated with bacteria. What increases the risk? The following factors may make you more likely to develop this condition:  A narrowing of the tube that carries food to the stomach (esophageal narrowing).  Having gastroesophageal reflux disease (GERD).  Having a weak immune system.  Having diabetes.  Having poor oral hygiene.  Being malnourished. The condition is more likely to occur when a person's cough (gag) reflex, or ability to swallow, has decreased. Some things that can cause this decrease include:  Having a brain injury or disease, such as stroke, seizures, Parkinson disease, dementia, or amyotrophic lateral sclerosis (ALS).  Being given a general anesthetic for procedures.  Drinking too much alcohol. If a person passes out and vomits, vomit can be inhaled into the lungs.  Taking certain medicines, such as tranquilizers or sedatives. What are the signs or symptoms? Symptoms of this condition include:  Fever.  A cough with secretions that are yellow, tan, or green.  Breathing problems, such as wheezing or shortness of breath.  Chest pain.  Being more tired  than usual (fatigue).  Having a history of coughing while eating or drinking.  Bad breath.  Bluish color to the lips, skin, or fingers. How is this diagnosed? This condition may be diagnosed based on:  A physical exam.  Tests, such as: ? Chest X-ray. ? Sputum culture. Saliva and mucus (sputum) are collected from the lungs or the tubes that carry air to the lungs (bronchi). The sputum is then tested for bacteria. ? Oximetry. A sensor or clip is placed on areas such as a finger, earlobe, or toe to measure the oxygen level in your blood. ? Blood tests. ? Swallowing study. This test looks at how food is swallowed and whether it goes into your breathing tube (trachea) or esophagus. ? Bronchoscopy. This test uses a flexible tube (bronchoscope) to see inside the lungs. How is this treated? This condition may be treated with:  Medicines. Antibiotic medicine will be given to kill the pneumonia bacteria. Other medicines may also be used to reduce fever or pain.  Breathing assistance and oxygen therapy. Depending on how well you are breathing, you may need to be given oxygen, or you may need breathing support from a breathing machine (ventilator).  Thoracentesis. This is a procedure to remove fluid that has built up in the space between the linings of the chest wall and the lungs.  Feeding tube and diet change. For people who have difficulty swallowing, a feeding tube might be placed in the stomach, or they may be asked to avoid certain food textures or liquids when eating. Follow these instructions at home: Medicines  Take  over-the-counter and prescription medicines only as told by your health care provider. ? If you were prescribed an antibiotic medicine, take it as told by your health care provider. Do not stop taking the antibiotic even if you start to feel better. ? Take cough medicine only if you are losing sleep. Cough medicine can prevent your body's natural ability to remove mucus  from your lungs. General instructions  Carefully follow any eating instructions you were given, such as avoiding certain food textures or thickening your liquids. Thickening liquids reduces the risk of developing aspiration pneumonia again.  Use breathing exercises such as postural drainage, deep breathing, and incentive spirometry to help expel secretions.  Rest as instructed by your health care provider.  Sleep in a semi-upright position at night. Try to sleep in a reclining chair, or place a few pillows under your head.  Do not use any products that contain nicotine or tobacco, such as cigarettes and e-cigarettes. If you need help quitting, ask your health care provider.  Keep all follow-up visits as told by your health care provider. This is important. Contact a health care provider if:  You have a fever.  You have a worsening cough with yellow, tan, or green secretions.  You have coughing while eating or drinking. Get help right away if:  You have worsening shortness of breath, wheezing, or difficulty breathing.  You have chest pain. Summary  Aspiration pneumonia is an infection in the lungs. It is caused when saliva or liquid from the mouth, throat, or stomach is inhaled into the lungs.  Aspiration pneumonia is more likely to occur when a person's cough reflex or ability to swallow has decreased.  Symptoms of aspiration pneumonia include coughing, breathing problems, fever, and chest pain.  Aspiration pneumonia may be treated with antibiotic medicine, other medicines to reduce pain or fever, and breathing assistance or oxygen therapy. This information is not intended to replace advice given to you by your health care provider. Make sure you discuss any questions you have with your health care provider. Document Released: 10/30/2008 Document Revised: 02/08/2016 Document Reviewed: 02/08/2016 Elsevier Interactive Patient Education  2019 [employer].

## 2018-03-25 ENCOUNTER — Encounter: Payer: Self-pay | Admitting: Rehabilitative and Restorative Service Providers"

## 2018-03-25 ENCOUNTER — Ambulatory Visit: Payer: 59 | Admitting: Rehabilitative and Restorative Service Providers"

## 2018-03-25 DIAGNOSIS — R29898 Other symptoms and signs involving the musculoskeletal system: Secondary | ICD-10-CM | POA: Diagnosis not present

## 2018-03-25 DIAGNOSIS — M25551 Pain in right hip: Secondary | ICD-10-CM | POA: Diagnosis not present

## 2018-03-25 NOTE — Patient Instructions (Signed)

## 2018-03-25 NOTE — Therapy (Addendum)
[facility] [place], [place], [address] Phone: [phone]   Fax:  [phone]  Physical Therapy Treatment  Patient Details  Name: Han Vejar MRN: [id number] Date of Birth: 1965/09/13 Referring Provider (PT): Dr Lynne Leader    Encounter Date: 03/25/2018  PT End of Session - 03/25/18 1107    Visit Number  3    Number of Visits  6    Date for PT Re-Evaluation  04/12/18    PT Start Time  1100    PT Stop Time  1158    PT Time Calculation (min)  58 min    Activity Tolerance  Patient tolerated treatment well       Past Medical History:  Diagnosis Date  . Allergy   . Asthma   . Moderate ulcerative colitis ([place]) 2009   Dr. Earle Gell colonoscopy report    History reviewed. No pertinent surgical history.  There were no vitals filed for this visit.  Subjective Assessment - 03/25/18 1107    Subjective  Patient reports that some days are better and some days are the same - has not had those better days in the past - before therapy.     Currently in Pain?  No/denies    Pain Onset  More than a month ago    Pain Frequency  Intermittent    Aggravating Factors   with putting on pants; lifting Rt LE to get in the vehicle; stepping up for higher surfaces - lifting leg          [facility] PT Assessment - 03/25/18 0001      Assessment   Medical Diagnosis  Rt hip pain     Referring Provider (PT)  Dr Lynne Leader     Onset Date/Surgical Date  12/30/16    Hand Dominance  Right    Next MD Visit  to schedule post 6 wk of PT     Prior Therapy  none       AROM   Right/Left Hip  --   tight end ranges Rt > Lt especially hip flexors      Flexibility   Hamstrings  tight Lt 50-55 deg > Rt ~ 60-655 deg     Quadriceps  tight Rt (heel ~ 3-4 inches from buttock) > Lt     ITB  tight Rt > Lt     Piriformis  tight Rt > Lt       Palpation   Spinal mobility  tenderness Rt lateral sacrum     Palpation comment  tight Rt  psoas and hip flexors; tight posterior hip ar lateral sacral border point tender                     [facility] Adult PT Treatment/Exercise - 03/25/18 0001      Lumbar Exercises: Stretches   Hip Flexor Stretch  Right;2 reps;30 seconds   supine knees to chest then ext Rt LE    Quad Stretch  Right;2 reps;30 seconds   added 4 inc foam roll at distal thigh      Moist Heat Therapy   Number Minutes Moist Heat  20 Minutes    Moist Heat Location  Lumbar Spine;Hip   anterior Rt hip      Electrical Stimulation   Electrical Stimulation Location  anterior hip     Electrical Stimulation Action  IFC    Electrical Stimulation Parameters  to tolerance    Electrical Stimulation Goals  Pain;Tone      Manual Therapy   Manual therapy comments  pt supine in hooklying position     Soft tissue mobilization  deep tissue work through [place] anterior hip into quads         Trigger Point Dry Needling - 03/25/18 0001    Consent Given?  Yes    Education Handout Provided  Yes    Other Dry Needling  Rt LE only - 8 needles proximal to mid quad     Quadriceps Response  Palpable increased muscle length           PT Education - 03/25/18 1146    Education Details  DN     Person(s) Educated  Patient    Methods  Explanation    Comprehension  Verbalized understanding          PT Long Term Goals - 03/01/18 1529      PT LONG TERM GOAL #1   Title  Improve tissue extensibility and ROM through LE's with patient to demonstrate increased mobility 04/13/2018    Time  6    Period  Weeks    Status  New      PT LONG TERM GOAL #2   Title  Decreaesd pain by 50-75% with hip flexion 04/13/2018    Time  6    Period  Weeks    Status  New      PT LONG TERM GOAL #3   Title  Independent in HEP 04/13/2018    Time  6    Period  Weeks    Status  New      PT LONG TERM GOAL #4   Title  Decrease FOTO to </= 27% limitation 04/13/2018     Time  6    Period  Weeks    Status  New            Plan -  03/25/18 1107    Rehab Potential  Good    PT Frequency  1x / week    PT Duration  6 weeks    PT Treatment/Interventions  Patient/family education;ADLs/Self Care Home Management;Cryotherapy;Electrical Stimulation;Iontophoresis 59m/ml Dexamethasone;Moist Heat;Ultrasound;Dry needling;Manual techniques;Neuromuscular re-education;Functional mobility training;Therapeutic activities;Therapeutic exercise    PT Next Visit Plan  review HEP; progress with stretching; myofacial ball work; dee tissue work to address tightness Rt hip flexors modalities as indicated     Consulted and Agree with Plan of Care  Patient       Patient will benefit from skilled therapeutic intervention in order to improve the following deficits and impairments:  Postural dysfunction, Improper body mechanics, Pain, Increased fascial restricitons, Increased muscle spasms, Hypomobility, Decreased range of motion, Decreased mobility, Decreased activity tolerance  Visit Diagnosis: Pain in right hip  Other symptoms and signs involving the musculoskeletal system     Problem List Patient Active Problem List   Diagnosis Date Noted  . Allergic rhinitis 06/09/2016  . Colitis 03/20/2016  . Ganglion cyst 03/20/2016  . Dyslipidemia 12/24/2014  . Vitamin D deficiency 12/24/2014  . Sleep apnea 12/22/2014  . Asthma, allergic 12/22/2014  . Dysphagia 12/22/2014     PNilda SimmerPT, MPH  03/25/2018, 11:49 AM  [facility] [address]Phone: [phone]  Fax:  [phone] Name: MUrie LoughnerMRN: [id number] of Birth: [birth year] PHYSICAL THERAPY DISCHARGE SUMMARY  Visits from Start of Care: 3  Current functional level related to goals / functional outcomes: See progress  note for discharge status    Remaining deficits: Continued tightness and intermittent pain    Education / Equipment: HEP Plan: Patient agrees to discharge.   Patient goals were partially met. Patient is being discharged due to the patient's request.  ?????    [place] requested that we hold PT at this time due to [place] pandemic. He will call to schedule additional appointments as he feels comfortable in returning to PT.    P. [name] PT, MPH 05/02/18 10:05 AM

## 2018-04-05 ENCOUNTER — Telehealth: Payer: Self-pay | Admitting: Rehabilitative and Restorative Service Providers"

## 2018-04-05 NOTE — Telephone Encounter (Signed)
Rescheduled appointment to patient's first available day off, April 20,2020. [facility] will be closed until April 22, 2018 due to COVID19 virus   Jason Brock P. [name] PT, MPH 04/05/18 2:26 PM

## 2018-04-08 ENCOUNTER — Encounter: Payer: 59 | Admitting: Rehabilitative and Restorative Service Providers"

## 2018-04-10 ENCOUNTER — Encounter: Payer: 59 | Admitting: Rehabilitative and Restorative Service Providers"

## 2018-05-02 ENCOUNTER — Telehealth: Payer: Self-pay | Admitting: Rehabilitative and Restorative Service Providers"

## 2018-05-02 NOTE — Telephone Encounter (Signed)
TC to patient to discuss status. He reports some improvement but has not done his exercises as he should. He would like to hold on further appointments at this time due to [place] pandemic. Patient will call to schedule additional appointments when he feels comfortable to return to PT. He was encouraged to continue with consistent HEP and call with any questions.  Jason Brock P. [name] PT, MPH 05/02/18 10:02 AM

## 2018-05-06 ENCOUNTER — Encounter: Payer: Self-pay | Admitting: Rehabilitative and Restorative Service Providers"

## 2018-05-09 ENCOUNTER — Telehealth: Payer: 59 | Admitting: Physician Assistant

## 2018-05-09 DIAGNOSIS — J4540 Moderate persistent asthma, uncomplicated: Secondary | ICD-10-CM

## 2018-05-09 NOTE — Progress Notes (Signed)
Based on what you shared with me, I feel your condition warrants further evaluation and I recommend that you be seen for a face to face office visit.  Giving hcow long this has been flared up and seems more of a daily, uncontrolled asthma issue than a true acute asthma exacerbation, I would recommend you contact Dr. Georgina Snell (listed as primary care provider) for a video assessment of symptoms so that your maintenance medications can be changed. If you have also noted increase in allergy symptoms, he may want to tweak that regimen as well to give better control of asthma symptoms as well. We only are able to treat mild asthma symptoms via e-visit.   NOTE: If you entered your credit card information for this eVisit, you will not be charged. You may see a "hold" on your card for the $35 but that hold will drop off and you will not have a charge processed.  If you are having a true medical emergency please call 911.  If you need an urgent face to face visit, [facility] has four urgent care centers for your convenience.    PLEASE NOTE: THE [facility] LOCATIONS AND URGENT CARE CLINICS DO NOT HAVE THE TESTING FOR CORONAVIRUS COVID19 AVAILABLE.  IF YOU FEEL YOU NEED THIS TEST YOU MUST GO TO A TRIAGE LOCATION AT [facility]   [email or web address] to reserve your spot online an avoid wait times  [facility] [address] [place], [place] [address] Modified hours of operation: Monday-Friday, 10 AM to 6 PM  Saturday & Sunday 10 AM to 4 PM *Across the street from [place] (New Address!) [address] [place], [place] [address] *Just off [address], across the road from [place] hours of operation: Monday-Friday, 10 AM to 5 PM  Closed Saturday & Sunday   The following sites will take your insurance:  . [facility] [place] a  Provider at this Location  [address] [place], [place] [address] . 10 am to 8 pm Monday-Friday . 12 pm to 8 pm Saturday-Sunday   . [facility] [place] a Provider at this Location  [place], [place], [place] [address] . 8 am to 8 pm Monday-Friday . 9 am to 6 pm Saturday . 11 am to 6 pm Sunday   . [facility] at [place] Get Driving Directions  [address] [place],  [address] . 8 am to 8 pm Monday-Friday . 8 am to 4 pm Saturday-Sunday   Your e-visit answers were reviewed by a board certified advanced clinical practitioner to complete your personal care plan.  Thank you for using e-Visits.

## 2018-05-10 ENCOUNTER — Encounter: Payer: Self-pay | Admitting: Family Medicine

## 2018-05-10 ENCOUNTER — Ambulatory Visit (INDEPENDENT_AMBULATORY_CARE_PROVIDER_SITE_OTHER): Payer: 59 | Admitting: Family Medicine

## 2018-05-10 VITALS — HR 82

## 2018-05-10 DIAGNOSIS — J302 Other seasonal allergic rhinitis: Secondary | ICD-10-CM | POA: Diagnosis not present

## 2018-05-10 DIAGNOSIS — J45901 Unspecified asthma with (acute) exacerbation: Secondary | ICD-10-CM | POA: Diagnosis not present

## 2018-05-10 MED ORDER — PREDNISONE 50 MG PO TABS: 50.0000 mg | ORAL_TABLET | Freq: Every day | ORAL | 0 refills | Status: DC

## 2018-05-10 MED ORDER — BUDESONIDE-FORMOTEROL FUMARATE 160-4.5 MCG/ACT IN AERO: 2.0000 | INHALATION_SPRAY | Freq: Two times a day (BID) | RESPIRATORY_TRACT | 12 refills | Status: DC

## 2018-05-10 MED ORDER — CEFDINIR 300 MG PO CAPS: 300.0000 mg | ORAL_CAPSULE | Freq: Two times a day (BID) | ORAL | 0 refills | Status: DC

## 2018-05-10 MED ORDER — IPRATROPIUM-ALBUTEROL 20-100 MCG/ACT IN AERS: 1.0000 | INHALATION_SPRAY | Freq: Four times a day (QID) | RESPIRATORY_TRACT | 2 refills | Status: DC

## 2018-05-10 NOTE — Progress Notes (Signed)
Virtual Visit  I connected with      Jason Brock  by a telemedicine application and verified that I am speaking with the correct person using two identifiers.   I discussed the limitations of evaluation and management by telemedicine and the availability of in person appointments. The patient expressed understanding and agreed to proceed.  History of Present Illness: Jason Brock is a 53 y.o. male who would like to discuss asthma.   [place] history of asthma that typically was pretty well controlled with Breo and very intermittent albuterol.  He became sick over a month ago and his Memory Dance was increased to 200.  About a month ago he got sick again with fever and had worsening breathing.  He has not had resolution of this since.  Fever has improved but he feels somewhat short of breath and fatigued.  He is not coughing.  He denies any recent contacts to anybody who has had COVID-19.  He notes he checked his oxygen saturation and got as low as 94% but over the last few days has been much improved.  He notes his albuterol inhaler helps a little but not completely.      Observations/Objective: Pulse 82   SpO2 97%  Wt Readings from Last 5 Encounters:  03/20/18 223 lb (101.2 kg)  02/21/18 225 lb (102.1 kg)  10/23/17 219 lb (99.3 kg)  07/12/17 225 lb (102.1 kg)  06/21/17 226 lb (102.5 kg)   Exam: Normal Speech.  No shortness of breath.  Normal voice quality.  No wheeze or stridor.  Lab and Radiology Results EXAM: CHEST - 2 VIEW  COMPARISON:  09/24/2003  FINDINGS: The heart size and mediastinal contours are within normal limits. Both lungs are clear. The visualized skeletal structures are unremarkable.  IMPRESSION: No active cardiopulmonary disease.   Electronically Signed   By: Kathreen Devoid   On: 03/20/2018 15:41 I personally (independently) visualized and performed the interpretation of the images attached in this note.   Assessment and Plan: 53 y.o. male  with shortness of breath.  Symptoms consistent with asthma exacerbation.  There may be some other factor that is not obvious at this time.  He certainly is not acutely sick but does not improving.  Plan to switch from [facility] to Symbicort as it may have slight improvement in symptom management.  Additionally will use Combivent for rescue inhaler instead of albuterol as that may have some benefit with the ipratropium.   Additionally use course of prednisone and Omnicef.  If not improving next step would be recheck in person for office visit visit with potentially repeat imaging and possible pulmonary function testing.  PDMP not reviewed this encounter. No orders of the defined types were placed in this encounter.  Meds ordered this encounter  Medications  . budesonide-formoterol (SYMBICORT) 160-4.5 MCG/ACT inhaler    Sig: Inhale 2 puffs into the lungs 2 (two) times daily.    Dispense:  1 Inhaler    Refill:  12    Failed Breo  . Ipratropium-Albuterol (COMBIVENT RESPIMAT) 20-100 MCG/ACT AERS respimat    Sig: Inhale 1 puff into the lungs every 6 (six) hours.    Dispense:  1 Inhaler    Refill:  2    Replaces albuterol  . predniSONE (DELTASONE) 50 MG tablet    Sig: Take 1 tablet (50 mg total) by mouth daily.    Dispense:  5 tablet    Refill:  0  . cefdinir (OMNICEF) 300 MG capsule  Sig: Take 1 capsule (300 mg total) by mouth 2 (two) times daily.    Dispense:  14 capsule    Refill:  0    Follow Up Instructions:    I discussed the assessment and treatment plan with the patient. The patient was provided an opportunity to ask questions and all were answered. The patient agreed with the plan and demonstrated an understanding of the instructions.   The patient was advised to call back or seek an in-person evaluation if the symptoms worsen or if the condition fails to improve as anticipated.  Time: 25 minutes of intraservice time, with >39 minutes of total time during today's visit.       Historical information moved to improve visibility of documentation.  Past Medical History:  Diagnosis Date  . Allergy   . Asthma   . Moderate ulcerative colitis ([place]) 2009   Dr. Earle Gell colonoscopy report   No past surgical history on file. Social History   Tobacco Use  . Smoking status: Never Smoker  . Smokeless tobacco: Former Systems developer    Types: Snuff  Substance Use Topics  . Alcohol use: Yes    Alcohol/week: 0.0 - 10.0 standard drinks   family history includes Cancer in his mother; Depression in his brother; Heart disease in his father.  Medications: Current Outpatient Medications  Medication Sig Dispense Refill  . acetaminophen (TYLENOL) 500 MG tablet Take 1,000 mg by mouth every 6 (six) hours as needed for moderate pain or headache.    . albuterol (PROVENTIL HFA;VENTOLIN HFA) 108 (90 Base) MCG/ACT inhaler Inhale 2 puffs into the lungs every 6 (six) hours as needed for wheezing or shortness of breath. 1 Inhaler 2  . AMBULATORY NON FORMULARY MEDICATION Continuous positive airway pressure (CPAP) titrate setting per protcal of H2O pressure, with all supplemental supplies as needed. 1 each 0  . Ascorbic Acid (VITAMIN C) 1000 MG tablet Take 2,000 mg by mouth daily.    Marland Kitchen atorvastatin (LIPITOR) 10 MG tablet TAKE 1 TABLET BY MOUTH EVERY DAY 30 tablet 11  . azelastine (ASTELIN) 0.1 % nasal spray Place 2 sprays into both nostrils 2 (two) times daily. Use in each nostril as directed 30 mL 12  . Cholecalciferol (VITAMIN D3) 2000 units capsule Take 2,000 Units by mouth daily.    . fluticasone furoate-vilanterol (BREO ELLIPTA) 200-25 MCG/INH AEPB Inhale 1 puff into the lungs daily. 30 each 12  . ibuprofen (ADVIL,MOTRIN) 200 MG tablet Take 400-800 mg by mouth every 6 (six) hours as needed (inflammation).    Marland Kitchen levocetirizine (XYZAL) 5 MG tablet TAKE 1 TABLET (5 MG TOTAL) BY MOUTH EVERY EVENING. (Patient taking differently: Take 5 mg by mouth daily. ) 90 tablet 2  . Multiple  Vitamins-Minerals (MULTIVITAMIN ADULT PO) Take 1 tablet by mouth daily.     . mupirocin ointment (BACTROBAN) 2 % Apply to affected area and to nose BID for 7 days 30 g 3  . Omega-3 Fatty Acids (FISH OIL) 1200 MG CAPS Take 2,400 mg by mouth 2 (two) times daily.    Marland Kitchen omeprazole (PRILOSEC) 40 MG capsule TAKE 1 CAPSULE BY MOUTH EVERY DAY 30 capsule 5  . trimethoprim-polymyxin b (POLYTRIM) ophthalmic solution Place 2 drops into the right eye every 4 (four) hours. 10 mL 0  . Budesonide (RHINOCORT ALLERGY NA) Place 1 spray into the nose daily.     . budesonide-formoterol (SYMBICORT) 160-4.5 MCG/ACT inhaler Inhale 2 puffs into the lungs 2 (two) times daily. 1 Inhaler 12  .  cefdinir (OMNICEF) 300 MG capsule Take 1 capsule (300 mg total) by mouth 2 (two) times daily. 14 capsule 0  . CLOBETASOL & CLOBETASOL EMUL EX Apply 1 application topically daily as needed (eczema).     . Ipratropium-Albuterol (COMBIVENT RESPIMAT) 20-100 MCG/ACT AERS respimat Inhale 1 puff into the lungs every 6 (six) hours. 1 Inhaler 2  . predniSONE (DELTASONE) 50 MG tablet Take 1 tablet (50 mg total) by mouth daily. 5 tablet 0   No current facility-administered medications for this visit.    Allergies  Allergen Reactions  . Dust Mite Extract Shortness Of Breath  . Bee Venom Swelling  . Pollen Extract Other (See Comments)  . Peanut (Diagnostic) Itching and Rash

## 2018-05-10 NOTE — Patient Instructions (Addendum)
Thank you for coming in today.  Switch from [facility] to Symbicort.  Dose of Symbicort is 2 puffs twice daily.  Do 1 puff then breathe and then do another puff for your 2 puffs.  Also will try using Combivent Respimat as the rescue inhaler as it has an extra ingredient in it.  You can get the manufacturer's coupon for both Symbicort and Combivent Respimat on the manufacturers websites.  Additionally start taking course of prednisone now.  If not improving start taking cefdinir antibiotic as well.  If not improving after all of this we should probably see each other in clinic.  If you need a work note please let me know I am happy to provide one.  Ultimately if we still do not know what is going on we may enlist the help of the pulmonologist however they are all busy doing critical care in the hospital and hard to get in with now.  Budesonide; Formoterol Inhalation What is this medicine? BUDESONIDE; FORMOTEROL (byoo DES oh nide; for [place] te rol) inhalation is a combination of 2 medicines that decrease inflammation and help to open up the airways in your lungs. It is used to treat asthma. It is also used to treat chronic obstructive pulmonary disease (COPD), including chronic bronchitis or emphysema. Do NOT use for an acute asthma or COPD attack. This medicine may be used for other purposes; ask your health care provider or pharmacist if you have questions. COMMON BRAND NAME(S): Symbicort What should I tell my health care provider before I take this medicine? They need to know if you have any of these conditions: -bone problems -diabetes -heart disease or irregular heartbeat -high blood pressure -immune system problems -infection -liver disease -worsening asthma -an unusual or allergic reaction to budesonide, formoterol, medicines, foods, dyes, or preservatives -pregnant or trying to get pregnant -breast-feeding How should I use this medicine? This medicine is inhaled through the mouth.  Rinse your mouth with water after use. Make sure not to swallow the water. Follow the directions on your prescription label. Do not use more often than directed. Do not stop taking except on your doctor's advice. Make sure that you are using your inhaler correctly. Ask your doctor or health care provider if you have any questions. A special MedGuide will be given to you by the pharmacist with each prescription and refill. Be sure to read this information carefully each time. Talk to your pediatrician regarding the use of this medicine in children. While this drug may be prescribed for children as young as 43 years of age for selected conditions, precautions do apply. Overdosage: If you think you have taken too much of this medicine contact a poison control center or emergency room at once. NOTE: This medicine is only for you. Do not share this medicine with others. What if I miss a dose? If you miss a dose, use it as soon as you remember. If it is almost time for your next dose, use only that dose and continue with your regular schedule, spacing doses evenly. Do not use double or extra doses. What may interact with this medicine? Do not take this medicine with any of the following medications: -MAOIs like Carbex, Eldepryl, Marplan, Nardil, and Parnate -mifepristone -probucol -procarbazine -some other medicines for asthma like formoterol, salmeterol This medicine may also interact with the following medications: -antibiotics like clarithromycin, erythromycin -cimetidine -diuretics -grapefruit juice -itraconazole -ketoconazole -medicines for depression, anxiety, or psychotic disturbances -medicines for irregular heartbeat -methadone -some heart medicines  like atenolol, metoprolol -some other medicines for breathing problems -some vaccines This list may not describe all possible interactions. Give your health care provider a list of all the medicines, herbs, non-prescription drugs, or dietary  supplements you use. Also tell them if you smoke, drink alcohol, or use illegal drugs. Some items may interact with your medicine. What should I watch for while using this medicine? Tell your doctor or health care professional if your symptoms do not improve or get worse. Do not use this medicine more than every 12 hours. NEVER use this medicine for an acute asthma or COPD attack. You should use your short-acting rescue inhalers for this purpose. If your symptoms get worse or if you need your short-acting inhalers more often, call your doctor right away. This medicine may increase your risk of getting an infection. Tell your doctor or health care professional if you are around anyone with measles or chickenpox, or if you develop sores or blisters that do not heal properly. What side effects may I notice from receiving this medicine? Side effects that you should report to your doctor or health care professional as soon as possible: -allergic reactions such as skin rash or itching, hives, swelling of the face, lips or tongue -breathing problems -changes in vision -chest pain -fast, irregular heartbeat -feeling faint or lightheaded, falls -fever -high blood pressure -nervousness -tremors -white patches or sores in mouth Side effects that usually do not require medical attention (report to your doctor or health care professional if they continue or are bothersome): -cough -different taste in mouth -headache -sore throat -stuffy nose -stomach upset This list may not describe all possible side effects. Call your doctor for medical advice about side effects. You may report side effects to FDA at 1-800-FDA-1088. Where should I keep my medicine? Keep out of the reach of children. Store in a dry place at room temperature between 20 and 25 degrees C (68 and 77 degrees F). Do not get the inhaler wet. Keep track of the number of doses used. Throw away the inhaler after using the marked number of  inhalations or after the expiration date, whichever comes first. Do not burn or puncture canister. NOTE: This sheet is a summary. It may not cover all possible information. If you have questions about this medicine, talk to your doctor, pharmacist, or health care provider.  2019 Elsevier/Gold Standard (2016-01-06 15:25:18)   Albuterol; Ipratropium respiratory inhalation spray (Combivent Respimat) What is this medicine? ALBUTEROL; IPRATROPIUM (al BYOO ter ole; i pra TROE pee um) has two bronchodilators. It helps open up the airways in your lungs to make it easier to breathe. This medicine is used to treat chronic obstructive pulmonary disease (COPD). This medicine may be used for other purposes; ask your health care provider or pharmacist if you have questions. COMMON BRAND NAME(S): Combivent Respimat What should I tell my health care provider before I take this medicine? They need to know if you have any of these conditions: -heart disease -high blood pressure -irregular heartbeat -an unusual or allergic reaction to albuterol, ipratropium, atropine, other medicines, foods, dyes, or preservatives -pregnant or trying to get pregnant -breast-feeding How should I use this medicine? This medicine is for inhalation only. Follow the instructions on your prescription label. Do not use more often than directed. Make sure that you are using your inhaler correctly. Ask you doctor or health care provider if you have any questions. Talk to your pediatrician regarding the use of this medicine in children. Special  care may be needed. Overdosage: If you think you have taken too much of this medicine contact a poison control center or emergency room at once. NOTE: This medicine is only for you. Do not share this medicine with others. What if I miss a dose? If you miss a dose, use it as soon as you can. If it is almost time for your next dose, use only that dose. Do not use double or extra doses. What may  interact with this medicine? Do not take this medicine with any of the following medications: -MAOIs like Carbex, Eldepryl, Marplan, Nardil, and Parnate This medicine may also interact with the following medications: -diuretics -medicines for depression, anxiety, or psychotic disturbances -medicines for irregular heartbeat -medicines for weight loss including some herbal products -methadone -pimozide -sertindole -some medicines for blood pressure or the heart This list may not describe all possible interactions. Give your health care provider a list of all the medicines, herbs, non-prescription drugs, or dietary supplements you use. Also tell them if you smoke, drink alcohol, or use illegal drugs. Some items may interact with your medicine. What should I watch for while using this medicine? Tell your doctor or health care professional if your symptoms do not improve. If your breathing gets worse while you are using this medicine, call your doctor right away. Do not stop using your medicine unless your doctor tells you to. Your mouth may get dry. Chewing sugarless gum or sucking hard candy, and drinking plenty of water may help. Contact your doctor if the problem does not go away or is severe. You may get dizzy or have blurred vision. Do not drive, use machinery, or do anything that needs mental alertness until you know how this medicine affects you. Do not stand or sit up quickly, especially if you are an older patient. This reduces the risk of dizzy or fainting spells. What side effects may I notice from receiving this medicine? Side effects that you should report to your doctor or health care professional as soon as possible: -allergic reactions like skin rash, itching or hives, swelling of the face, lips, or tongue -breathing problems -feeling faint or lightheaded, falls -fever -high blood pressure -irregular heartbeat or chest pain -muscle cramps or weakness -pain, tingling, numbness in  the hands or feet -vomiting Side effects that usually do not require medical attention (report to your doctor or health care professional if they continue or are bothersome): -blurred vision -cough -difficulty passing urine -difficulty sleeping -headache -nervousness or trembling -stuffy or runny nose -unusual taste -upset stomach This list may not describe all possible side effects. Call your doctor for medical advice about side effects. You may report side effects to FDA at 1-800-FDA-1088. Where should I keep my medicine? Keep out of the reach of children. Store at a room temperature between 15 and 30 degrees C (59 and 86 degrees F). Do not freeze. After assembly, the inhaler should be discarded at the latest 3 months after first use or after the inhaler is locked, whichever comes first. NOTE: This sheet is a summary. It may not cover all possible information. If you have questions about this medicine, talk to your doctor, pharmacist, or health care provider.  2019 Elsevier/Gold Standard (2015-02-04 12:10:33)

## 2018-05-21 ENCOUNTER — Telehealth: Payer: Self-pay | Admitting: Family Medicine

## 2018-05-21 NOTE — Telephone Encounter (Signed)
Pt called and left message stating with the Symbicort his breathing has improved but he has has had headaches everyday. Pt is requesting to switch back to breo due to severity of the headaches.

## 2018-05-22 MED ORDER — FLUTICASONE FUROATE-VILANTEROL 200-25 MCG/INH IN AEPB: 1.0000 | INHALATION_SPRAY | Freq: Every day | RESPIRATORY_TRACT | 12 refills | Status: DC

## 2018-05-22 NOTE — Telephone Encounter (Signed)
Pt advised and will send update thru mychart.

## 2018-05-22 NOTE — Telephone Encounter (Signed)
Breo refilled.  Keep me updated how things are going.

## 2018-05-25 ENCOUNTER — Other Ambulatory Visit: Payer: Self-pay | Admitting: Gastroenterology

## 2018-06-20 ENCOUNTER — Other Ambulatory Visit: Payer: Self-pay | Admitting: Gastroenterology

## 2018-07-25 ENCOUNTER — Other Ambulatory Visit: Payer: Self-pay | Admitting: Gastroenterology

## 2018-10-21 ENCOUNTER — Other Ambulatory Visit: Payer: Self-pay | Admitting: Gastroenterology

## 2018-11-20 ENCOUNTER — Other Ambulatory Visit: Payer: Self-pay | Admitting: Gastroenterology

## 2019-03-07 ENCOUNTER — Ambulatory Visit: Payer: 59 | Attending: Internal Medicine

## 2019-03-07 DIAGNOSIS — Z23 Encounter for immunization: Secondary | ICD-10-CM | POA: Insufficient documentation

## 2019-03-07 NOTE — Progress Notes (Signed)
   Covid-19 Vaccination Clinic  Name:  Aria Pickrell    MRN: [id number] DOB: Aug 16, 1965  03/07/2019  Mr. Edgell was observed post Covid-19 immunization for 15 minutes without incidence. He was provided with Vaccine Information Sheet and instruction to access the V-Safe system.   Mr. Osmun was instructed to call 911 with any severe reactions post vaccine: Marland Kitchen Difficulty breathing  . Swelling of your face and throat  . A fast heartbeat  . A bad rash all over your body  . Dizziness and weakness    Immunizations Administered    Name Date Dose VIS Date Route   Pfizer COVID-19 Vaccine 03/07/2019 11:26 AM 0.3 mL 12/27/2018 Intramuscular   Manufacturer: [place]   Lot: [id number]   [place]: [id number]

## 2019-03-25 ENCOUNTER — Telehealth: Payer: Self-pay | Admitting: Family Medicine

## 2019-03-25 ENCOUNTER — Other Ambulatory Visit: Payer: Self-pay | Admitting: Family Medicine

## 2019-03-25 NOTE — Telephone Encounter (Signed)
Patient will need a follow up to get further refills of his cholesterol medications. Please call to schedule.

## 2019-03-25 NOTE — Telephone Encounter (Signed)
Patient is not coming to [place] anymore. HE will establish with someone else in the [facility]. No further questions at this time.

## 2019-03-27 ENCOUNTER — Other Ambulatory Visit: Payer: Self-pay

## 2019-03-27 ENCOUNTER — Encounter: Payer: Self-pay | Admitting: Family Medicine

## 2019-03-27 ENCOUNTER — Ambulatory Visit (INDEPENDENT_AMBULATORY_CARE_PROVIDER_SITE_OTHER): Payer: 59 | Admitting: Family Medicine

## 2019-03-27 VITALS — BP 125/88 | HR 77 | Ht 69.5 in | Wt 227.8 lb

## 2019-03-27 DIAGNOSIS — K529 Noninfective gastroenteritis and colitis, unspecified: Secondary | ICD-10-CM | POA: Diagnosis not present

## 2019-03-27 DIAGNOSIS — J45909 Unspecified asthma, uncomplicated: Secondary | ICD-10-CM

## 2019-03-27 DIAGNOSIS — E559 Vitamin D deficiency, unspecified: Secondary | ICD-10-CM | POA: Diagnosis not present

## 2019-03-27 DIAGNOSIS — Z0000 Encounter for general adult medical examination without abnormal findings: Secondary | ICD-10-CM | POA: Diagnosis not present

## 2019-03-27 DIAGNOSIS — Z6833 Body mass index (BMI) 33.0-33.9, adult: Secondary | ICD-10-CM

## 2019-03-27 DIAGNOSIS — E785 Hyperlipidemia, unspecified: Secondary | ICD-10-CM

## 2019-03-27 DIAGNOSIS — G4730 Sleep apnea, unspecified: Secondary | ICD-10-CM

## 2019-03-27 DIAGNOSIS — E669 Obesity, unspecified: Secondary | ICD-10-CM | POA: Insufficient documentation

## 2019-03-27 DIAGNOSIS — E6609 Other obesity due to excess calories: Secondary | ICD-10-CM

## 2019-03-27 DIAGNOSIS — R1310 Dysphagia, unspecified: Secondary | ICD-10-CM

## 2019-03-27 MED ORDER — DICLOFENAC SODIUM 1 % EX GEL: 4.0000 g | Freq: Four times a day (QID) | CUTANEOUS | 6 refills | Status: DC | PRN

## 2019-03-27 NOTE — Progress Notes (Signed)
Office Visit Note   Patient: Jason Brock           Date of Birth: Jan 28, 1965           MRN: [id number] Visit Date: 03/27/2019 Requested by: Gregor Hams, MD [place],  [place] [id number] PCP: Eunice Blase, MD  Subjective: Chief Complaint  Patient presents with  . Annual Exam    HPI: He is here to reestablish care, he is here for a wellness exam.  He was diagnosed with Crohn's colitis in 2009.  He had a follow-up colonoscopy in 2019 and was told that he had no signs of inflammatory bowel disease whatsoever.  He is not on any treatment for this.  He is not having any issues with constipation or diarrhea.  He has gained about 50 pounds over the past few years.  He realizes that he does not eat healthy foods, eats a lot of sugar.  He and his fiance are planning to start making some changes.  He has a family history of heart disease in his father who died at age 8.  Patient had a stress test a few years ago.  He is asymptomatic from a cardiac standpoint.  He was placed on Lipitor about 2 years ago.  Asthma has been well controlled with Breo, Singulair and Xyzal.  He is also on allergy shots.  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   He is scheduled for dental visit next week.  He had an eye exam recently.                ROS:   All other systems were reviewed and are negative.  Objective: Vital Signs: BP 125/88   Pulse 77   Ht 5' 9.5" (1.765 m)   Wt 227 lb 12.8 oz (103.3 kg)   BMI 33.16 kg/m   Physical Exam:  General:  Alert and oriented, in no acute distress. Pulm:  Breathing unlabored. Psy:  Normal mood, congruent affect. Skin: No suspicious lesions. HEENT:  [place]/AT, PERRLA, EOM Full, no nystagmus.  Funduscopic examination within normal limits.  No conjunctival erythema.  Tympanic membranes are pearly gray with normal landmarks.  External ear canals are normal.  Nasal  passages are clear.  Oropharynx is clear.  No significant lymphadenopathy.  No thyromegaly or nodules.  2+ carotid pulses without bruits. CV: Regular rate and rhythm without murmurs, rubs, or gallops.  No peripheral edema.  2+ radial and posterior tibial pulses. Lungs: Clear to auscultation throughout with no wheezing or areas of consolidation. Abd: Bowel sounds are active, no hepatosplenomegaly or masses.  Soft and nontender.  No audible bruits.  No evidence of ascites. Musculoskeletal: He has swelling/edema near the tibialis posterior of his right ankle.  He reports that this is occasionally uncomfortable when he supinates his foot but does not notice it when he walks.   Imaging: None today  Assessment & Plan: 1.  Wellness examination -Labs today, make recommendations based on results.  2.  History of colitis, seems to have resolved.  3.  Family history of heart disease - Consider CT calcium score.  4.  Asthma, currently well controlled.  5.  Hyperlipidemia, tolerating Lipitor  6.  Obesity -We will discuss lifestyle changes after lab results are available.  7.  History of vitamin D deficiency     Procedures: No procedures performed  No notes on file     PMFS History:  Patient Active Problem List   Diagnosis Date Noted  . Obesity 03/27/2019  . Allergic rhinitis 06/09/2016  . Colitis 03/20/2016  . Ganglion cyst 03/20/2016  . Dyslipidemia 12/24/2014  . Vitamin D deficiency 12/24/2014  . Sleep apnea 12/22/2014  . Asthma 12/22/2014  . Dysphagia 12/22/2014   Past Medical History:  Diagnosis Date  . Allergy   . Asthma   . Moderate ulcerative colitis ([place]) 2009   Dr. Earle Gell colonoscopy report    Family History  Problem Relation Age of Onset  . Cancer Mother   . Heart disease Father   . Depression Brother     History reviewed. No pertinent surgical history. Social History   Occupational History  . Occupation: Engineer, structural  Tobacco Use  . Smoking  status: Never Smoker  . Smokeless tobacco: Former Systems developer    Types: Snuff  Substance and Sexual Activity  . Alcohol use: Yes    Alcohol/week: 0.0 - 10.0 standard drinks  . Drug use: No  . Sexual activity: Yes

## 2019-03-28 ENCOUNTER — Telehealth: Payer: Self-pay | Admitting: Family Medicine

## 2019-03-28 LAB — COMPREHENSIVE METABOLIC PANEL
AG Ratio: 1.8 (calc) (ref 1.0–2.5)
ALT: 48 U/L — ABNORMAL HIGH (ref 9–46)
AST: 28 U/L (ref 10–35)
Albumin: 4.6 g/dL (ref 3.6–5.1)
Alkaline phosphatase (APISO): 63 U/L (ref 35–144)
BUN: 18 mg/dL (ref 7–25)
CO2: 25 mmol/L (ref 20–32)
Calcium: 9.8 mg/dL (ref 8.6–10.3)
Chloride: 101 mmol/L (ref 98–110)
Creat: 1.02 mg/dL (ref 0.70–1.33)
Globulin: 2.5 g/dL (calc) (ref 1.9–3.7)
Glucose, Bld: 112 mg/dL — ABNORMAL HIGH (ref 65–99)
Potassium: 4.7 mmol/L (ref 3.5–5.3)
Sodium: 139 mmol/L (ref 135–146)
Total Bilirubin: 0.5 mg/dL (ref 0.2–1.2)
Total Protein: 7.1 g/dL (ref 6.1–8.1)

## 2019-03-28 LAB — CBC WITH DIFFERENTIAL/PLATELET
Absolute Monocytes: 647 cells/uL (ref 200–950)
Basophils Absolute: 33 cells/uL (ref 0–200)
Basophils Relative: 0.4 %
Eosinophils Absolute: 100 cells/uL (ref 15–500)
Eosinophils Relative: 1.2 %
HCT: 46.2 % (ref 38.5–50.0)
Hemoglobin: 15.7 g/dL (ref 13.2–17.1)
Lymphs Abs: 2266 cells/uL (ref 850–3900)
MCH: 29.4 pg (ref 27.0–33.0)
MCHC: 34 g/dL (ref 32.0–36.0)
MCV: 86.5 fL (ref 80.0–100.0)
MPV: 11.3 fL (ref 7.5–12.5)
Monocytes Relative: 7.8 %
Neutro Abs: 5254 cells/uL (ref 1500–7800)
Neutrophils Relative %: 63.3 %
Platelets: 314 10*3/uL (ref 140–400)
RBC: 5.34 10*6/uL (ref 4.20–5.80)
RDW: 12.9 % (ref 11.0–15.0)
Total Lymphocyte: 27.3 %
WBC: 8.3 10*3/uL (ref 3.8–10.8)

## 2019-03-28 LAB — LIPID PANEL
Cholesterol: 164 mg/dL (ref <200)
HDL: 32 mg/dL — ABNORMAL LOW (ref >=40)
LDL Cholesterol (Calc): 92 mg/dL (calc)
Non-HDL Cholesterol (Calc): 132 mg/dL (calc) — ABNORMAL HIGH (ref <130)
Total CHOL/HDL Ratio: 5.1 (calc) — ABNORMAL HIGH (ref <5.0)
Triglycerides: 283 mg/dL — ABNORMAL HIGH (ref <150)

## 2019-03-28 LAB — HIGH SENSITIVITY CRP: hs-CRP: 1 mg/L

## 2019-03-28 LAB — THYROID PANEL WITH TSH
Free Thyroxine Index: 2.6 (ref 1.4–3.8)
T3 Uptake: 29 % (ref 22–35)
T4, Total: 9.1 ug/dL (ref 4.9–10.5)
TSH: 3.07 mIU/L (ref 0.40–4.50)

## 2019-03-28 LAB — HEMOGLOBIN A1C
Hgb A1c MFr Bld: 5.7 % of total Hgb — ABNORMAL HIGH (ref <5.7)
Mean Plasma Glucose: 117 (calc)
eAG (mmol/L): 6.5 (calc)

## 2019-03-28 LAB — VITAMIN D 25 HYDROXY (VIT D DEFICIENCY, FRACTURES): Vit D, 25-Hydroxy: 33 ng/mL (ref 30–100)

## 2019-03-28 LAB — PSA: PSA: 0.5 ng/mL (ref <=4.0)

## 2019-03-28 NOTE — Telephone Encounter (Signed)
Labs are notable for the following:  Blood glucose is elevated in prediabetes range at 112.  In addition, hemoglobin A1c is also in prediabetes range at 5.7.  Finally, triglycerides are once again almost 10 times higher than the HDL which increases the risk of developing diabetes.  It is very important to maintain a regular exercise regimen and to minimize dietary intake of processed carbohydrates and sweets (including breads, pastas, cereals, beer, etc.).  We should recheck these labs in about 6 months to be sure everything is improving.  All else looks good.

## 2019-04-07 ENCOUNTER — Ambulatory Visit: Payer: 59

## 2019-04-08 ENCOUNTER — Ambulatory Visit: Payer: 59 | Attending: Internal Medicine

## 2019-04-08 DIAGNOSIS — Z23 Encounter for immunization: Secondary | ICD-10-CM

## 2019-04-08 NOTE — Progress Notes (Signed)
   Covid-19 Vaccination Clinic  Name:  Sladen Plancarte    MRN: [id number] DOB: 05/10/1965  04/08/2019  Mr. Pasch was observed post Covid-19 immunization for 15 minutes without incident. He was provided with Vaccine Information Sheet and instruction to access the V-Safe system.   Mr. Arneson was instructed to call 911 with any severe reactions post vaccine: Marland Kitchen Difficulty breathing  . Swelling of face and throat  . A fast heartbeat  . A bad rash all over body  . Dizziness and weakness   Immunizations Administered    Name Date Dose VIS Date Route   Pfizer COVID-19 Vaccine 04/08/2019  8:44 AM 0.3 mL 12/27/2018 Intramuscular   Manufacturer: [place]   Lot: [id number]   [place]: [id number]

## 2019-04-10 ENCOUNTER — Encounter: Payer: Self-pay | Admitting: Family Medicine

## 2019-04-17 ENCOUNTER — Other Ambulatory Visit: Payer: Self-pay | Admitting: Family Medicine

## 2019-04-22 ENCOUNTER — Encounter: Payer: Self-pay | Admitting: Family Medicine

## 2019-04-22 ENCOUNTER — Ambulatory Visit: Payer: 59 | Admitting: Family Medicine

## 2019-04-22 ENCOUNTER — Other Ambulatory Visit: Payer: Self-pay

## 2019-04-22 VITALS — BP 140/88 | HR 74

## 2019-04-22 DIAGNOSIS — N50811 Right testicular pain: Secondary | ICD-10-CM | POA: Diagnosis not present

## 2019-04-22 MED ORDER — LEVOFLOXACIN 500 MG PO TABS: 500.0000 mg | ORAL_TABLET | Freq: Every day | ORAL | 0 refills | Status: DC

## 2019-04-22 NOTE — Progress Notes (Signed)
   Office Visit Note   Patient: Jason Brock           Date of Birth: Apr 29, 1965           MRN: [id number] Visit Date: 04/22/2019 Requested by: Eunice Blase, MD [address] [place],  [place] [address] PCP: Eunice Blase, MD  Subjective: Chief Complaint  Patient presents with  . testicular pain since 10/21 after bike ride in the woods    pain worsened over this past weekend after hiking  . tender area found on self- exam    just on right side    HPI: He is here with testicular pain.  Around September he noticed some discomfort after riding his bike.  Pain was never severe, but never went away completely.  Recently after a long bike ride followed by a walk/jog, he noticed increasing pain mostly on the right side.  He did not notice any mass.  He has not had any urinary symptoms.  No previous problems in this area.              ROS:   All other systems were reviewed and are negative.  Objective: Vital Signs: BP 140/88   Pulse 74   Physical Exam:  General:  Alert and oriented, in no acute distress. Pulm:  Breathing unlabored. Psy:  Normal mood, congruent affect.  GU: He has no inguinal hernia, no lymphadenopathy.  He has no testicular masses and testicles are symmetric.  He has epididymal swelling and tenderness on the right and this reproduces his pain.  Imaging: None today  Assessment & Plan: 1.  Right testicular pain, suspect epididymitis. -Treatment with Levaquin for 10 days.  If not improving he will contact me and we will order scrotal ultrasound.     Procedures: No procedures performed  No notes on file     PMFS History: Patient Active Problem List   Diagnosis Date Noted  . Obesity 03/27/2019  . Allergic rhinitis 06/09/2016  . Ganglion cyst 03/20/2016  . Dyslipidemia 12/24/2014  . Vitamin D deficiency 12/24/2014  . Sleep apnea 12/22/2014  . Asthma 12/22/2014  . Dysphagia 12/22/2014   Past Medical History:  Diagnosis Date  . Allergy   . Asthma     . Moderate ulcerative colitis ([place]) 2009   Dr. Earle Gell colonoscopy report    Family History  Problem Relation Age of Onset  . Cancer Mother   . Heart disease Father   . Depression Brother     History reviewed. No pertinent surgical history. Social History   Occupational History  . Occupation: Engineer, structural  Tobacco Use  . Smoking status: Never Smoker  . Smokeless tobacco: Former Systems developer    Types: Snuff  Substance and Sexual Activity  . Alcohol use: Yes    Alcohol/week: 0.0 - 10.0 standard drinks  . Drug use: No  . Sexual activity: Yes

## 2019-05-19 ENCOUNTER — Encounter: Payer: Self-pay | Admitting: Family Medicine

## 2019-05-19 DIAGNOSIS — N50811 Right testicular pain: Secondary | ICD-10-CM

## 2019-05-19 MED ORDER — LEVOFLOXACIN 500 MG PO TABS: 500.0000 mg | ORAL_TABLET | Freq: Every day | ORAL | 0 refills | Status: DC

## 2019-05-19 MED ORDER — ATORVASTATIN CALCIUM 10 MG PO TABS: 10.0000 mg | ORAL_TABLET | Freq: Every day | ORAL | 1 refills | Status: DC

## 2019-05-20 MED ORDER — ATORVASTATIN CALCIUM 10 MG PO TABS: 10.0000 mg | ORAL_TABLET | Freq: Every day | ORAL | 1 refills | Status: DC

## 2019-05-20 NOTE — Addendum Note (Signed)
Addended by: Hortencia Pilar on: 05/20/2019 08:37 AM   Modules accepted: Orders

## 2019-05-22 ENCOUNTER — Other Ambulatory Visit: Payer: Self-pay

## 2019-05-22 ENCOUNTER — Ambulatory Visit (INDEPENDENT_AMBULATORY_CARE_PROVIDER_SITE_OTHER): Payer: 59

## 2019-05-22 DIAGNOSIS — N50811 Right testicular pain: Secondary | ICD-10-CM | POA: Diagnosis not present

## 2019-05-23 ENCOUNTER — Telehealth: Payer: Self-pay | Admitting: Family Medicine

## 2019-05-23 NOTE — Telephone Encounter (Signed)
Ultrasound revealed a right-sided hydrocele.

## 2019-06-17 ENCOUNTER — Other Ambulatory Visit: Payer: Self-pay | Admitting: Family Medicine

## 2019-08-13 ENCOUNTER — Telehealth: Payer: Self-pay | Admitting: Family Medicine

## 2019-08-13 NOTE — Telephone Encounter (Signed)
3/11 labs faxed to [facility], Dr. Clarene Essex [phone]

## 2019-09-01 IMAGING — DX DG CHEST 2V
2 series · 2 of 2 positions shown · non-contrast
Comparison: 09/24/2003

CLINICAL DATA: Cough

EXAM:
CHEST - 2 VIEW

[chest pa]
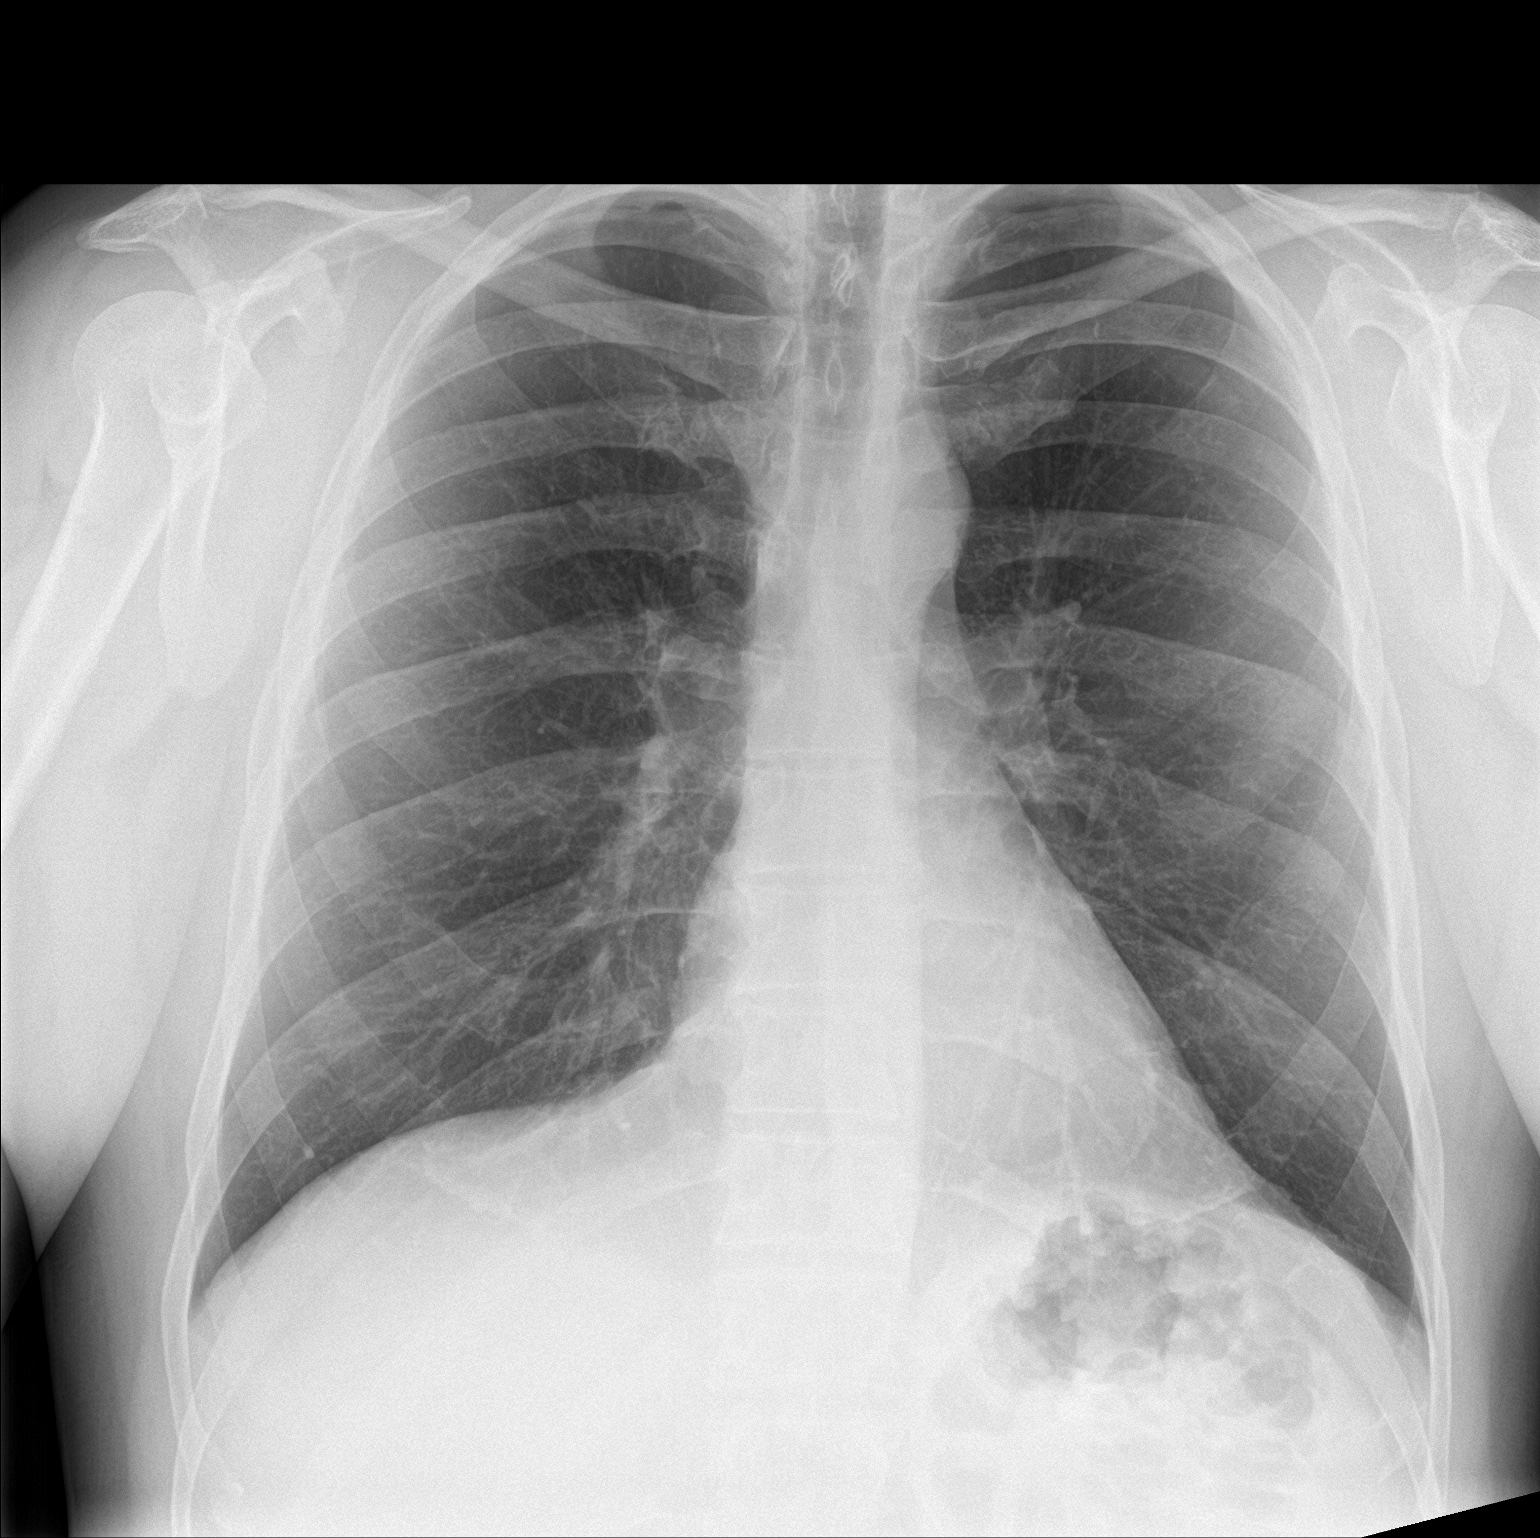

[chest lat]
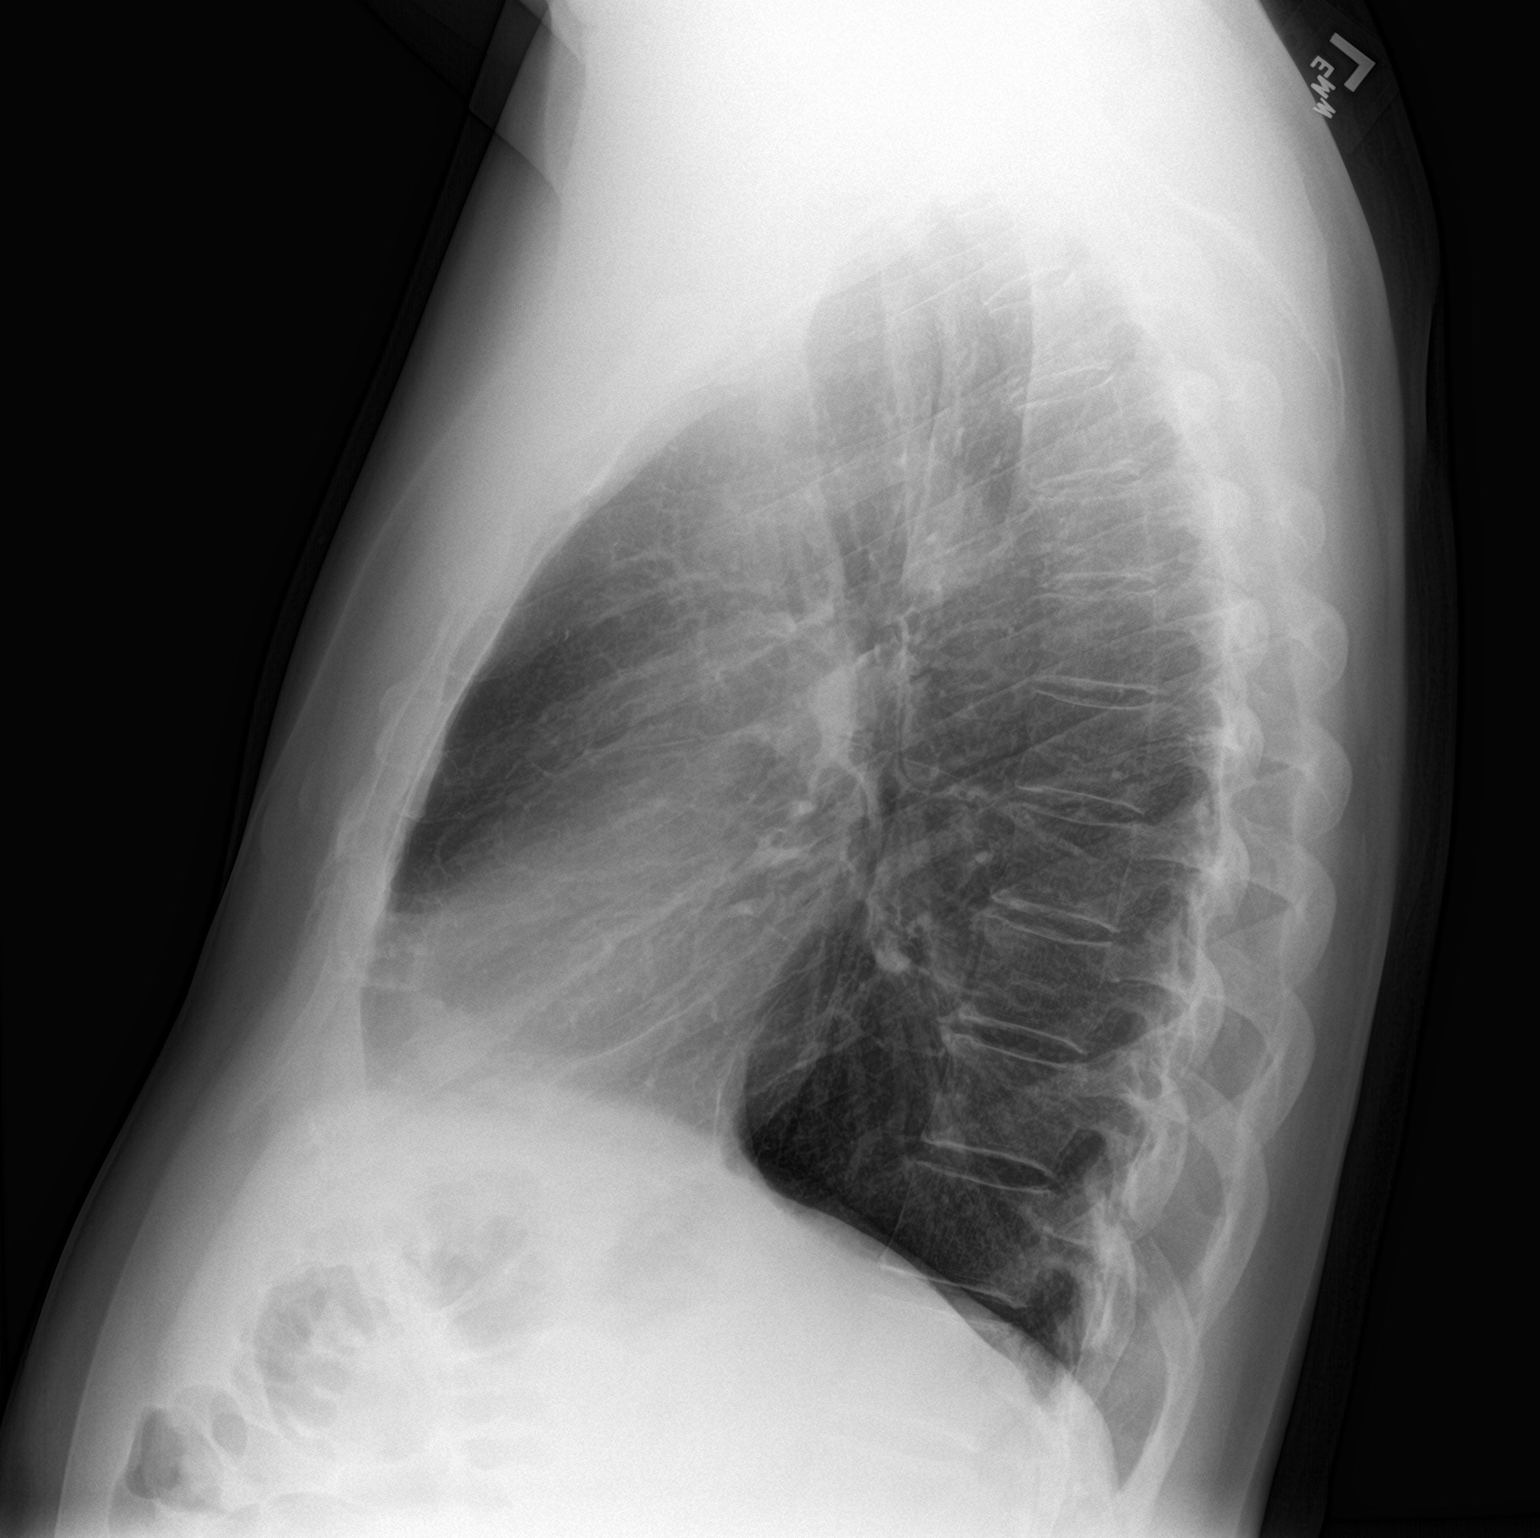

[2 of 2 positions shown; findings below may reference images not displayed]

FINDINGS: The heart size and mediastinal contours are within normal limits.
Both lungs are clear. The visualized skeletal structures are
unremarkable.
IMPRESSION: No active cardiopulmonary disease.

## 2019-10-16 ENCOUNTER — Encounter: Payer: Self-pay | Admitting: Family Medicine

## 2019-10-16 ENCOUNTER — Other Ambulatory Visit: Payer: Self-pay

## 2019-10-16 ENCOUNTER — Ambulatory Visit (INDEPENDENT_AMBULATORY_CARE_PROVIDER_SITE_OTHER): Payer: 59 | Admitting: Family Medicine

## 2019-10-16 VITALS — BP 109/71 | HR 65 | Ht 69.5 in | Wt 194.4 lb

## 2019-10-16 DIAGNOSIS — G8929 Other chronic pain: Secondary | ICD-10-CM

## 2019-10-16 DIAGNOSIS — E785 Hyperlipidemia, unspecified: Secondary | ICD-10-CM

## 2019-10-16 DIAGNOSIS — R0981 Nasal congestion: Secondary | ICD-10-CM

## 2019-10-16 DIAGNOSIS — M545 Low back pain: Secondary | ICD-10-CM | POA: Diagnosis not present

## 2019-10-16 DIAGNOSIS — R739 Hyperglycemia, unspecified: Secondary | ICD-10-CM

## 2019-10-16 NOTE — Progress Notes (Signed)
° °  Office Visit Note   Patient: Jason Brock           Date of Birth: Nov 21, 1965           MRN: [id number] Visit Date: 10/16/2019 Requested by: Eunice Blase, MD [address] [place],  [place] [address] PCP: Eunice Blase, MD  Subjective: No chief complaint on file.   HPI: He is here for follow-up hyperglycemia and hyperlipidemia.  Since last visit he has exercise more and has been eating a low carbohydrate diet.  He has lost 33 pounds.  He feels well overall.  He has been having low back pain for about 6 months with no injury.  Pain primarily when standing in one place.  Bilateral pain with no radicular symptoms.  He has chronic head congestion.  He was told that he might benefit from sinus surgery but would like a second ENT opinion.              ROS:   All other systems were reviewed and are negative.  Objective: Vital Signs: BP 109/71    Pulse 65    Ht 5' 9.5" (1.765 m)    Wt 194 lb 6.4 oz (88.2 kg)    BMI 28.30 kg/m   Physical Exam:  General:  Alert and oriented, in no acute distress. Pulm:  Breathing unlabored. Psy:  Normal mood, congruent affect.  CV: Regular rate and rhythm without murmurs, rubs, or gallops.  No peripheral edema.  2+ radial and posterior tibial pulses. Lungs: Clear to auscultation throughout with no wheezing or areas of consolidation. Low back: He is tender near both SI joints.   Imaging: No results found.  Assessment & Plan: 1.  Hyperglycemia and hyperlipidemia -Recheck labs today. -Doing extremely well with lifestyle change.  2.  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  3.  Chronic head congestion -Second ENT opinion.     Procedures: No procedures performed  No notes on file     PMFS History: Patient Active Problem List   Diagnosis Date Noted   Obesity 03/27/2019   Allergic rhinitis 06/09/2016   Ganglion cyst 03/20/2016   Dyslipidemia 12/24/2014   Vitamin D deficiency 12/24/2014     Sleep apnea 12/22/2014   Asthma 12/22/2014   Dysphagia 12/22/2014   Past Medical History:  Diagnosis Date   Allergy    Asthma    Moderate ulcerative colitis ([place]) 2009   Dr. Earle Gell colonoscopy report    Family History  Problem Relation Age of Onset   Cancer Mother    Heart disease Father    Depression Brother     No past surgical history on file. Social History   Occupational History   Occupation: Engineer, structural  Tobacco Use   Smoking status: Never Smoker   Smokeless tobacco: Former Systems developer    Types: Snuff  Vaping Use   Vaping Use: Never used  Substance and Sexual Activity   Alcohol use: Yes    Alcohol/week: 0.0 - 10.0 standard drinks   Drug use: No   Sexual activity: Yes

## 2019-10-17 ENCOUNTER — Telehealth: Payer: Self-pay | Admitting: Family Medicine

## 2019-10-17 LAB — LIPID PANEL
Cholesterol: 191 mg/dL (ref <200)
HDL: 41 mg/dL (ref >=40)
LDL Cholesterol (Calc): 127 mg/dL (calc) — ABNORMAL HIGH
Non-HDL Cholesterol (Calc): 150 mg/dL (calc) — ABNORMAL HIGH (ref <130)
Total CHOL/HDL Ratio: 4.7 (calc) (ref <5.0)
Triglycerides: 123 mg/dL (ref <150)

## 2019-10-17 LAB — HEMOGLOBIN A1C
Hgb A1c MFr Bld: 5.6 % of total Hgb (ref <5.7)
Mean Plasma Glucose: 114 (calc)
eAG (mmol/L): 6.3 (calc)

## 2019-10-17 LAB — BASIC METABOLIC PANEL
BUN/Creatinine Ratio: 26 (calc) — ABNORMAL HIGH (ref 6–22)
BUN: 30 mg/dL — ABNORMAL HIGH (ref 7–25)
CO2: 26 mmol/L (ref 20–32)
Calcium: 9.8 mg/dL (ref 8.6–10.3)
Chloride: 106 mmol/L (ref 98–110)
Creat: 1.17 mg/dL (ref 0.70–1.33)
Glucose, Bld: 99 mg/dL (ref 65–99)
Potassium: 5 mmol/L (ref 3.5–5.3)
Sodium: 141 mmol/L (ref 135–146)

## 2019-10-17 NOTE — Telephone Encounter (Signed)
Excellent improvement in lab values!  Keep up the good work!

## 2019-10-20 ENCOUNTER — Encounter: Payer: Self-pay | Admitting: Rehabilitative and Restorative Service Providers"

## 2019-10-20 ENCOUNTER — Ambulatory Visit (INDEPENDENT_AMBULATORY_CARE_PROVIDER_SITE_OTHER): Payer: 59 | Admitting: Rehabilitative and Restorative Service Providers"

## 2019-10-20 ENCOUNTER — Other Ambulatory Visit: Payer: Self-pay

## 2019-10-20 DIAGNOSIS — M545 Low back pain, unspecified: Secondary | ICD-10-CM | POA: Diagnosis not present

## 2019-10-20 DIAGNOSIS — R29898 Other symptoms and signs involving the musculoskeletal system: Secondary | ICD-10-CM

## 2019-10-20 NOTE — Patient Instructions (Signed)
Access Code: [email or web address] URL: [email or web address] Date: 10/20/2019 Prepared by: Rudell Cobb  Exercises Supine Hamstring Stretch - 2 x daily - 7 x weekly - 1 sets - 8 reps - 15 seconds hold Thomas Stretch on Table - 2 x daily - 7 x weekly - 1 sets - 3 reps - 20 seconds hold Supine Piriformis Stretch with Foot on Ground - 2 x daily - 7 x weekly - 1 sets - 3 reps - 30 seconds hold Sidelying Thoracic Lumbar Rotation - 2 x daily - 7 x weekly - 1 sets - 10 reps - 5 hold Standing Quadratus Lumborum Stretch with Doorway - 2 x daily - 7 x weekly - 1 sets - 3 reps - 20 seconds hold

## 2019-10-20 NOTE — Therapy (Signed)
[place], [place], [address] Phone: [phone]   Fax:  [phone]  Physical Therapy Evaluation  Patient Details  Name: Jason Brock MRN: [id number] Date of Birth: [birth year] Referring Provider (PT): Eunice Blase, MD   Encounter Date: 10/20/2019   PT End of Session - 10/20/19 1331    Visit Number 1    Number of Visits 12    Date for PT Re-Evaluation 12/01/19    PT Start Time 0718    PT Stop Time 0800    PT Time Calculation (min) 42 min    Activity Tolerance Patient tolerated treatment well    Behavior During Therapy [facility] for tasks assessed/performed           Past Medical History:  Diagnosis Date  . Allergy   . Asthma   . Moderate ulcerative colitis ([place]) 2009   Dr. Earle Gell colonoscopy report    History reviewed. No pertinent surgical history.  There were no vitals filed for this visit.    Subjective Assessment - 10/20/19 0720    Subjective The patient reports h/o LBP 15+ years ago (soft tissue injury when lifting) and has had intermittent low back/ sacro-iliac pain.  He reports standing aggravates his low back.  He mountain bikes for exercise.  When he bends to do work at home for longer periods, there is increased pain.    Patient Stated Goals Be able to stand for any period of time without low back pain.    Currently in Pain? Yes    Pain Score 2    goes up in intensity with standing   Pain Location Back    Pain Orientation Lower    Pain Descriptors / Indicators Aching;Sore    Pain Type Chronic pain    Pain Onset More than a month ago    Pain Frequency Intermittent    Aggravating Factors  standing 5+ minutes    Pain Relieving Factors sitting              [facility] PT Assessment - 10/20/19 0724      Assessment   Medical Diagnosis low back pain without sciatica    Referring Provider (PT) Eunice Blase, MD    Onset Date/Surgical Date 10/16/19    Hand Dominance  Right      Precautions   Precautions None      Restrictions   Weight Bearing Restrictions No      Balance Screen   Has the patient fallen in the past 6 months No    Has the patient had a decrease in activity level because of a fear of falling?  No    Is the patient reluctant to leave their home because of a fear of falling?  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)    Leisure bikes      Observation/Other Assessments   Focus on Therapeutic Outcomes (FOTO)  29% limitation      Sensation   Light Touch Appears Intact      ROM / Strength   AROM / PROM / Strength AROM;Strength      AROM   Overall AROM  Deficits    AROM Assessment Site Lumbar    Lumbar  Flexion 50% limited    Lumbar Extension 25% limited   with pain   Lumbar - Right Side Bend 50% limitation   painful   Lumbar - Left Side Bend 50% limitation   painful   Lumbar - Right Rotation 50% limitation    Lumbar - Left Rotation 50% limitation      Strength   Overall Strength Within functional limits for tasks performed    Strength Assessment Site Hip;Knee;Ankle    Right/Left Hip Right;Left    Right Hip Flexion 5/5    Left Hip Flexion 5/5    Right/Left Knee Right;Left    Right Knee Flexion 5/5    Right Knee Extension 5/5    Left Knee Flexion 5/5    Left Knee Extension 5/5    Right/Left Ankle Right;Left    Right Ankle Dorsiflexion 5/5    Right Ankle Plantar Flexion 5/5    Left Ankle Dorsiflexion 5/5    Left Ankle Plantar Flexion 5/5      Flexibility   Soft Tissue Assessment /Muscle Length yes    Hamstrings bilateral HS tightness to 45-50 degrees from full extension with 90/90 starting position, + thomas test stretch R and L sides x 1 reps      Palpation   Spinal mobility hypomobile t/o spine  with all PA mobs from mid thoracic to lumbar region    SI assessment  painful with SI compression    Palpation comment tenderness bilateral QL                      Objective measurements completed on examination: See above findings.       [facility] Adult PT Treatment/Exercise - 10/20/19 1342      Exercises   Exercises Lumbar      Lumbar Exercises: Stretches   Active Hamstring Stretch Right;Left;5 reps;20 seconds    Hip Flexor Stretch Right;Left;1 rep;30 seconds    Piriformis Stretch Right;Left;2 reps;30 seconds    Other Lumbar Stretch Exercise standing QL stretch in door frame      Lumbar Exercises: Sidelying   Other Sidelying Lumbar Exercises spinal rotation sidelying                   PT Education - 10/20/19 0755    Education Details HEP    Person(s) Educated Patient    Methods Explanation;Demonstration;Handout    Comprehension Returned demonstration;Verbalized understanding               PT Long Term Goals - 10/20/19 1338      PT LONG TERM GOAL #1   Title The patient will be indep with HEP for LE strengthening.    Time 6    Period Weeks    Target Date 12/01/19      PT LONG TERM GOAL #2   Title The patient will decrease functional limitation per FOTO from 29% to < or equal to 25%.    Time 6    Period Weeks    Target Date 12/01/19      PT LONG TERM GOAL #3   Title The patient will report LBP with standing x 20 minutes < or equal to 2/10.    Time 6    Period Weeks    Target Date 12/01/19      PT LONG TERM GOAL #4   Title The patient will transition sit>stand without pain in low back.    Time 6    Period Weeks    Target  Date 12/01/19                  Plan - 10/20/19 1345    Clinical Impression Statement The patient is a 54 yo male presenting to OP physical therapy with h/o intermittent SI, LBP worse with standing.  He presents with impairments in flexibility (HS, hip flexors, quads) in LEs, tenderness to palpation QL, and  pain that limits standing.  Patient has good strength and participates in home exercise (mostly cardio of running and biking).  PT to address deficits to reduce pain with standing and progress mobility to tolerance.    Stability/Clinical Decision Making Stable/Uncomplicated    Clinical Decision Making Low    Rehab Potential Good    PT Frequency 2x / week    PT Duration 6 weeks    PT Treatment/Interventions ADLs/Self Care Home Management;Taping;Manual techniques;Patient/family education;Dry needling;Therapeutic activities;Therapeutic exercise;Neuromuscular re-education;Electrical Stimulation;Cryotherapy;Moist Heat;Traction;Passive range of motion    PT Next Visit Plan progress HEP working from flexibility to stability (straight leg dead lift, squats), work on [employer] of spine,    Consulted and Agree with Plan of Care Patient           Patient will benefit from skilled therapeutic intervention in order to improve the following deficits and impairments:  Pain, Postural dysfunction, Decreased range of motion, Impaired flexibility, Hypomobility, Increased fascial restricitons, Decreased activity tolerance  Visit Diagnosis: Bilateral low back pain without sciatica, unspecified chronicity  Other symptoms and signs involving the musculoskeletal system     Problem List Patient Active Problem List   Diagnosis Date Noted  . Obesity 03/27/2019  . Allergic rhinitis 06/09/2016  . Ganglion cyst 03/20/2016  . Dyslipidemia 12/24/2014  . Vitamin D deficiency 12/24/2014  . Sleep apnea 12/22/2014  . Asthma 12/22/2014  . Dysphagia 12/22/2014    Virginia Francisco, PT 10/20/2019, 4:40 PM  [facility] [place], [place], [address] Phone: [phone]   Fax:  [phone]  Name: Jason Brock MRN: [id number] Date of Birth: Feb 25, 1965

## 2019-10-29 ENCOUNTER — Ambulatory Visit (INDEPENDENT_AMBULATORY_CARE_PROVIDER_SITE_OTHER): Payer: 59 | Admitting: Physical Therapy

## 2019-10-29 ENCOUNTER — Other Ambulatory Visit: Payer: Self-pay

## 2019-10-29 ENCOUNTER — Encounter: Payer: Self-pay | Admitting: Physical Therapy

## 2019-10-29 DIAGNOSIS — R29898 Other symptoms and signs involving the musculoskeletal system: Secondary | ICD-10-CM

## 2019-10-29 DIAGNOSIS — M545 Low back pain, unspecified: Secondary | ICD-10-CM

## 2019-10-29 NOTE — Therapy (Addendum)
[place], [place], [address] Phone: [phone]   Fax:  [phone]  Physical Therapy Treatment  Patient Details  Name: Jason Brock MRN: [id number] Date of Birth: [birth year] Referring Provider (PT): Eunice Blase, MD   Encounter Date: 10/29/2019   PT End of Session - 10/29/19 1605    Visit Number 2    Number of Visits 12    Date for PT Re-Evaluation 12/01/19    PT Start Time 6283    PT Stop Time 1650    PT Time Calculation (min) 45 min    Activity Tolerance Patient tolerated treatment well    Behavior During Therapy [facility] for tasks assessed/performed           Past Medical History:  Diagnosis Date  . Allergy   . Asthma   . Moderate ulcerative colitis ([place]) 2009   Dr. Earle Gell colonoscopy report    History reviewed. No pertinent surgical history.  There were no vitals filed for this visit.   Subjective Assessment - 10/29/19 1605    Subjective Pt reports he has been doing the exercises of HEP.  "I wonder if the exercises have made it feel worse".    Saturday he received booster shot; had muscle aches the following night. He feels he may have overstretched his Lt hamstring on Sunday.   He has been wearing SI belt and feels this may be helping.   Currently in Pain? Yes    Pain Score 2     Pain Location Back    Pain Orientation Lower    Pain Descriptors / Indicators Sore;Aching    Aggravating Factors  standing more than 5 min    Pain Relieving Factors sitting              [facility] PT Assessment - 10/29/19 0001      Assessment   Medical Diagnosis low back pain without sciatica    Referring Provider (PT) Eunice Blase, MD    Onset Date/Surgical Date 10/16/19    Hand Dominance Right    Next MD Visit PRN            [facility] Adult PT Treatment/Exercise - 10/29/19 0001      Lumbar Exercises: Stretches   Passive Hamstring Stretch Right;Left;2 reps;30 seconds   standing and  hooklying   Hip Flexor Stretch Right;Left;2 reps;30 seconds   seated and Thomas position    Prone on Elbows Stretch 2 reps;10 seconds    Press Ups 2 reps;10 seconds    Piriformis Stretch Right;Left;3 reps;20 seconds    Other Lumbar Stretch Exercise standing QL stretch in door frame x 20 sec x 2 reps each side.   Open book x 5 reps each side.     Other Lumbar Stretch Exercise mid doorway stretch x 20 sec, unilateral high doorway stretch x 20 sec (unable to tolerate bilat arms in high position       Lumbar Exercises: Aerobic   Tread Mill 2.46mh x 5 min for warm up.,       Lumbar Exercises: Seated   Sit to Stand 5 reps   with core engaged and eccentric lowering   Other Seated Lumbar Exercises seated on dynadisc with pelvic tilts and circles.       Lumbar Exercises: Supine   Bridge 10 reps;5 seconds      Lumbar Exercises: Prone   Opposite Arm/Leg Raise Right arm/Left leg;Left arm/Right leg;10 reps  PT Education - 10/29/19 1704    Education Details HEP updated    Person(s) Educated Patient    Methods Explanation;Handout    Comprehension Verbalized understanding               PT Long Term Goals - 10/20/19 1338      PT LONG TERM GOAL #1   Title The patient will be indep with HEP for LE strengthening.    Time 6    Period Weeks    Target Date 12/01/19      PT LONG TERM GOAL #2   Title The patient will decrease functional limitation per FOTO from 29% to < or equal to 25%.    Time 6    Period Weeks    Target Date 12/01/19      PT LONG TERM GOAL #3   Title The patient will report LBP with standing x 20 minutes < or equal to 2/10.    Time 6    Period Weeks    Target Date 12/01/19      PT LONG TERM GOAL #4   Title The patient will transition sit>stand without pain in low back.    Time 6    Period Weeks    Target Date 12/01/19                 Plan - 10/29/19 1623    Clinical Impression Statement Minor cues given for form of exercises  for less strain to LB.   Made small adjustments to HEP including 2 low back strengthening exercises and pec stretch.  Limited tolerance for bilat high doorway stretch.  He tolerated all other exercises without increase in pain. Goals are ongoing.    Stability/Clinical Decision Making Stable/Uncomplicated    Rehab Potential Good    PT Frequency 2x / week    PT Duration 6 weeks    PT Treatment/Interventions ADLs/Self Care Home Management;Taping;Manual techniques;Patient/family education;Dry needling;Therapeutic activities;Therapeutic exercise;Neuromuscular re-education;Electrical Stimulation;Cryotherapy;Moist Heat;Traction;Passive range of motion    PT Next Visit Plan progress HEP working from flexibility to stability (straight leg dead lift, squats), work on mobility of spine.    PT Home Exercise Plan [email or web address]    Consulted and Agree with Plan of Care Patient           Patient will benefit from skilled therapeutic intervention in order to improve the following deficits and impairments:  Pain, Postural dysfunction, Decreased range of motion, Impaired flexibility, Hypomobility, Increased fascial restricitons, Decreased activity tolerance  Visit Diagnosis: Bilateral low back pain without sciatica, unspecified chronicity  Other symptoms and signs involving the musculoskeletal system     Problem List Patient Active Problem List   Diagnosis Date Noted  . Obesity 03/27/2019  . Allergic rhinitis 06/09/2016  . Ganglion cyst 03/20/2016  . Dyslipidemia 12/24/2014  . Vitamin D deficiency 12/24/2014  . Sleep apnea 12/22/2014  . Asthma 12/22/2014  . Dysphagia 12/22/2014   Kerin Perna, PTA 10/29/19 5:04 PM  [place], [place], [address] Phone: [phone]   Fax:  [phone]  Name: Darl Kuss MRN: [id number] Date of Birth: 1965/05/22

## 2019-10-29 NOTE — Patient Instructions (Signed)
Access Code: [email or web address]: [email or web address]: 10/13/2021Prepared by: [place] on Table - 2 x daily - 7 x weekly - 1 sets - 3 reps - 20 seconds hold  Supine Piriformis Stretch with Foot on Ground - 2 x daily - 7 x weekly - 1 sets - 3 reps - 30 seconds hold  Sidelying Thoracic Lumbar Rotation - 2 x daily - 7 x weekly - 1 sets - 10 reps - 5 hold  Hooklying Hamstring Stretch with Strap - 2 x daily - 7 x weekly - 1 sets - 3 reps - 20 seconds hold  Bridge - 1 x daily - 3 x weekly - 1 sets - 10 reps - 5 hold  Prone Alternating Arm and Leg Lifts - 1 x daily - 3 x weekly - 1 sets - 10 reps  Standing Quadratus Lumborum Stretch with Doorway - 2 x daily - 7 x weekly - 1 sets - 3 reps - 20 seconds hold  Doorway Pec Stretch at 90 Degrees Abduction - 1 x daily - 7 x weekly - 1 sets - 2 reps - 15 sec hold  Single Arm Doorway Pec Stretch at 120 Degrees Abduction - 1 x daily - 7 x weekly - 1 sets - 2 reps - 15 sec hold

## 2019-11-03 ENCOUNTER — Ambulatory Visit (INDEPENDENT_AMBULATORY_CARE_PROVIDER_SITE_OTHER): Payer: 59 | Admitting: Rehabilitative and Restorative Service Providers"

## 2019-11-03 ENCOUNTER — Other Ambulatory Visit: Payer: Self-pay

## 2019-11-03 DIAGNOSIS — M545 Low back pain, unspecified: Secondary | ICD-10-CM | POA: Diagnosis not present

## 2019-11-03 DIAGNOSIS — M25551 Pain in right hip: Secondary | ICD-10-CM

## 2019-11-03 DIAGNOSIS — R29898 Other symptoms and signs involving the musculoskeletal system: Secondary | ICD-10-CM

## 2019-11-03 NOTE — Patient Instructions (Signed)
Access Code: [email or web address] URL: [email or web address] Date: 11/03/2019 Prepared by: Rudell Cobb  Exercises Jason Brock on Table - 2 x daily - 7 x weekly - 1 sets - 3 reps - 20 seconds hold Supine Piriformis Stretch with Foot on Ground - 2 x daily - 7 x weekly - 1 sets - 3 reps - 30 seconds hold Hooklying Hamstring Stretch with Strap - 2 x daily - 7 x weekly - 1 sets - 3 reps - 20 seconds hold Sidelying Thoracic Lumbar Rotation - 2 x daily - 7 x weekly - 1 sets - 10 reps - 5 hold Side Plank on Elbow - 2 x daily - 7 x weekly - 1 sets - 5-10 reps - 10 seconds hold Prone Alternating Arm and Leg Lifts - 1 x daily - 3 x weekly - 1 sets - 10 reps Standing Quadratus Lumborum Stretch with Doorway - 2 x daily - 7 x weekly - 1 sets - 3 reps - 20 seconds hold Doorway Pec Stretch at 90 Degrees Abduction - 1 x daily - 7 x weekly - 1 sets - 2 reps - 15 sec hold The Diver - 2 x daily - 7 x weekly - 1 sets - 5-10 reps Squat with Chair Touch - 2 x daily - 7 x weekly - 1 sets - 10 reps

## 2019-11-03 NOTE — Therapy (Signed)
[place], [place], [address] Phone: [phone]   Fax:  [phone]  Physical Therapy Treatment  Patient Details  Name: Jason Brock MRN: [id number] Date of Birth: 06/25/1965 Referring Provider (PT): Eunice Blase, MD   Encounter Date: 11/03/2019   PT End of Session - 11/03/19 0720    Visit Number 3    Number of Visits 12    Date for PT Re-Evaluation 12/01/19    PT Start Time 0715    PT Stop Time 6440    PT Time Calculation (min) 40 min    Activity Tolerance Patient tolerated treatment well    Behavior During Therapy [facility] for tasks assessed/performed           Past Medical History:  Diagnosis Date   Allergy    Asthma    Moderate ulcerative colitis ([place]) 2009   Dr. Earle Gell colonoscopy report    No past surgical history on file.  There were no vitals filed for this visit.   Subjective Assessment - 11/03/19 0718    Subjective The patient feels that strengthening exercises are helping.  He continues to ride the bike and feels some rounding in the low back.    Patient Stated Goals Be able to stand for any period of time without low back pain.    Currently in Pain? No/denies              [facility] PT Assessment - 11/03/19 0715      Assessment   Medical Diagnosis low back pain without sciatica    Referring Provider (PT) Eunice Blase, MD    Onset Date/Surgical Date 10/16/19    Hand Dominance Right                         [facility] Adult PT Treatment/Exercise - 11/03/19 0715      Exercises   Exercises Lumbar      Lumbar Exercises: Stretches   Active Hamstring Stretch Right;Left;3 reps;30 seconds    Piriformis Stretch Right;Left;1 rep;30 seconds    Other Lumbar Stretch Exercise forward flexion with trunk rotation (to elevated mat table)  x 8 reps R and L    Other Lumbar Stretch Exercise Trunk/flexion and extension on elevated mat table (modified down dog to  up dog); standing QL stretch in doorway.      Lumbar Exercises: Aerobic   Tread Mill 3.0 mph x 4 minutes without UE support      Lumbar Exercises: Standing   Heel Raises 15 reps    Functional Squats 10 reps    Functional Squats Limitations squat with 10 lb lift off elevated surface (14")    Lifting Weights (lbs) 10    Lifting Limitations straight leg dead lifts x 10 reps x 2 sets    Forward Lunge 10 reps    Forward Lunge Limitations isometric lunge holds with 10 reps of trunk rotation holding yoga block    Other Standing Lumbar Exercises standing x band walk for hip strengthening (green band) x 15 feet R and L    Other Standing Lumbar Exercises standing diver x 5 reps R and L      Lumbar Exercises: Supine   Bridge 10 reps;5 seconds      Lumbar Exercises: Sidelying   Other Sidelying Lumbar Exercises side plank x 10 second holds x 5 reps R and L      Lumbar Exercises: Prone   Opposite Arm/Leg Raise  Right arm/Left leg;Left arm/Right leg;10 reps    Opposite Arm/Leg Raise Limitations cues for technique    Other Prone Lumbar Exercises 8 point plank x 5 second holds x 10 reps;     Other Prone Lumbar Exercises plank on elbows x 3 reps with good form, extended plank moving R and L UEs ant/posterior x 8 reps each side                  PT Education - 11/03/19 0801    Education Details HEP progression    Person(s) Educated Patient    Methods Explanation;Demonstration;Handout    Comprehension Verbalized understanding;Returned demonstration               PT Long Term Goals - 10/20/19 1338      PT LONG TERM GOAL #1   Title The patient will be indep with HEP for LE strengthening.    Time 6    Period Weeks    Target Date 12/01/19      PT LONG TERM GOAL #2   Title The patient will decrease functional limitation per FOTO from 29% to < or equal to 25%.    Time 6    Period Weeks    Target Date 12/01/19      PT LONG TERM GOAL #3   Title The patient will report LBP with  standing x 20 minutes < or equal to 2/10.    Time 6    Period Weeks    Target Date 12/01/19      PT LONG TERM GOAL #4   Title The patient will transition sit>stand without pain in low back.    Time 6    Period Weeks    Target Date 12/01/19                 Plan - 11/03/19 0801    Clinical Impression Statement The patient tolerated progression of core strength and LE loading activities.  Modified HEP to include more strengthening + continued stretching.  Patient with anterior tightness in chest that limits prone extension exercise.  PT to continue to patient tolerance.    Stability/Clinical Decision Making Stable/Uncomplicated    Rehab Potential Good    PT Frequency 2x / week    PT Duration 6 weeks    PT Treatment/Interventions ADLs/Self Care Home Management;Taping;Manual techniques;Patient/family education;Dry needling;Therapeutic activities;Therapeutic exercise;Neuromuscular re-education;Electrical Stimulation;Cryotherapy;Moist Heat;Traction;Passive range of motion    PT Next Visit Plan progress strengthening, work on technique on bike,  (straight leg dead lift, squats), work on mobility of spine.    PT Home Exercise Plan [email or web address]    Consulted and Agree with Plan of Care Patient           Patient will benefit from skilled therapeutic intervention in order to improve the following deficits and impairments:  Pain, Postural dysfunction, Decreased range of motion, Impaired flexibility, Hypomobility, Increased fascial restricitons, Decreased activity tolerance  Visit Diagnosis: Bilateral low back pain without sciatica, unspecified chronicity  Other symptoms and signs involving the musculoskeletal system  Pain in right hip     Problem List Patient Active Problem List   Diagnosis Date Noted   Obesity 03/27/2019   Allergic rhinitis 06/09/2016   Ganglion cyst 03/20/2016   Dyslipidemia 12/24/2014   Vitamin D deficiency 12/24/2014   Sleep apnea 12/22/2014   Asthma  12/22/2014   Dysphagia 12/22/2014    Mical Brun, PT 11/03/2019, 8:04 AM  [place] [address] [place], [place],  [place] Phone: [phone]   Fax:  [phone]  Name: Jason Brock MRN: [id number] Date of Birth: 30-Jan-1965

## 2019-11-05 ENCOUNTER — Encounter: Payer: 59 | Admitting: Physical Therapy

## 2019-11-12 ENCOUNTER — Ambulatory Visit (INDEPENDENT_AMBULATORY_CARE_PROVIDER_SITE_OTHER): Payer: 59 | Admitting: Rehabilitative and Restorative Service Providers"

## 2019-11-12 ENCOUNTER — Other Ambulatory Visit: Payer: Self-pay

## 2019-11-12 DIAGNOSIS — M25551 Pain in right hip: Secondary | ICD-10-CM

## 2019-11-12 DIAGNOSIS — R29898 Other symptoms and signs involving the musculoskeletal system: Secondary | ICD-10-CM

## 2019-11-12 DIAGNOSIS — M545 Low back pain, unspecified: Secondary | ICD-10-CM | POA: Diagnosis not present

## 2019-11-12 NOTE — Patient Instructions (Signed)
Access Code: [email or web address] URL: [email or web address] Date: 11/12/2019 Prepared by: Rudell Cobb  Exercises [place] on Table - 2 x daily - 7 x weekly - 1 sets - 3 reps - 20 seconds hold Supine Piriformis Stretch with Foot on Ground - 2 x daily - 7 x weekly - 1 sets - 3 reps - 30 seconds hold Hooklying Hamstring Stretch with Strap - 2 x daily - 7 x weekly - 1 sets - 3 reps - 20 seconds hold Side Plank on Elbow - 2 x daily - 7 x weekly - 1 sets - 5-10 reps - 10 seconds hold Prone Alternating Arm and Leg Lifts - 1 x daily - 3 x weekly - 1 sets - 10 reps Standing Quadratus Lumborum Stretch with Doorway - 2 x daily - 7 x weekly - 1 sets - 3 reps - 20 seconds hold Doorway Pec Stretch at 90 Degrees Abduction - 1 x daily - 7 x weekly - 1 sets - 2 reps - 15 sec hold The Diver - 2 x daily - 7 x weekly - 1 sets - 5-10 reps Squat with Chair Touch - 2 x daily - 7 x weekly - 1 sets - 10 reps Side Lunge Adductor Stretch - 2 x daily - 7 x weekly - 1 sets - 3 reps - 30 seconds hold Half-Kneeling Trunk Rotation - 2 x daily - 7 x weekly - 1 sets - 10 reps

## 2019-11-12 NOTE — Therapy (Signed)
[place], [place], [address] Phone: [phone]   Fax:  [phone]  Physical Therapy Treatment  Patient Details  Name: Jason Brock MRN: [id number] Date of Birth: [birth year] Referring Provider (PT): Eunice Blase, MD   Encounter Date: 11/12/2019   PT End of Session - 11/12/19 0720    Visit Number 4    Number of Visits 12    Date for PT Re-Evaluation 12/01/19    PT Start Time 0715    PT Stop Time 0755    PT Time Calculation (min) 40 min    Activity Tolerance Patient tolerated treatment well    Behavior During Therapy [facility] for tasks assessed/performed           Past Medical History:  Diagnosis Date  . Allergy   . Asthma   . Moderate ulcerative colitis ([place]) 2009   Dr. Earle Gell colonoscopy report    No past surgical history on file.  There were no vitals filed for this visit.   Subjective Assessment - 11/12/19 0718    Subjective The patient reports that he cancelled last week due to having a cold.  He got Covid tested due to his step daughter having Covid.    Patient Stated Goals Be able to stand for any period of time without low back pain.    Currently in Pain? No/denies    Pain Score --   stiff in SI region in the mornings             [facility] PT Assessment - 11/12/19 0721      Assessment   Medical Diagnosis low back pain without sciatica    Referring Provider (PT) Eunice Blase, MD    Onset Date/Surgical Date 10/16/19    Hand Dominance Right                         [facility] Adult PT Treatment/Exercise - 11/12/19 0721      Exercises   Exercises Lumbar      Lumbar Exercises: Stretches   Active Hamstring Stretch Right;Left;30 seconds;2 reps    Hip Flexor Stretch Right;Left;2 reps;30 seconds    Press Ups 3 reps;10 seconds    Press Ups Limitations noted straight back through t-spine.  worked on thoracic extension keeping ribs in contact with mat x 3  reps    Other Lumbar Stretch Exercise standing with foot on mat rotating towards flexed leg to open up at [facility] for self mobilization.    Other Lumbar Stretch Exercise hip adductor stretch R and L standing x 2 reps x 30 seconds      Lumbar Exercises: Aerobic   Tread Mill 2.8 mph x 4 minutes      Lumbar Exercises: Standing   Functional Squats 10 reps    Forward Lunge 10 reps    Forward Lunge Limitations isometric lunge holds with 10 reps of trunk rotation holding yoga block    Other Standing Lumbar Exercises lateral step ups to 8" surface x 12 reps R and L sides without UE support      Lumbar Exercises: Seated   Other Seated Lumbar Exercises on physioball performing lateral tilts and circles on ball    Other Seated Lumbar Exercises 1/2 kneeling x 10 reps x 2 sets of thoracic rotation                  PT Education - 11/12/19 0759    Education Details  HEP    Person(s) Educated Patient    Methods Explanation;Demonstration;Handout    Comprehension Verbalized understanding;Returned demonstration               PT Long Term Goals - 10/20/19 1338      PT LONG TERM GOAL #1   Title The patient will be indep with HEP for LE strengthening.    Time 6    Period Weeks    Target Date 12/01/19      PT LONG TERM GOAL #2   Title The patient will decrease functional limitation per FOTO from 29% to < or equal to 25%.    Time 6    Period Weeks    Target Date 12/01/19      PT LONG TERM GOAL #3   Title The patient will report LBP with standing x 20 minutes < or equal to 2/10.    Time 6    Period Weeks    Target Date 12/01/19      PT LONG TERM GOAL #4   Title The patient will transition sit>stand without pain in low back.    Time 6    Period Weeks    Target Date 12/01/19                 Plan - 11/12/19 1552    Clinical Impression Statement The patient continues with tightness in bilateral hips and SI/lumbar region.  He also has tightness in thoracic spine and into  anterior chest musculature.  PT continuing to progress strengthening, and flexibility to patient tolerance.    Stability/Clinical Decision Making Stable/Uncomplicated    Rehab Potential Good    PT Frequency 2x / week    PT Duration 6 weeks    PT Treatment/Interventions ADLs/Self Care Home Management;Taping;Manual techniques;Patient/family education;Dry needling;Therapeutic activities;Therapeutic exercise;Neuromuscular re-education;Electrical Stimulation;Cryotherapy;Moist Heat;Traction;Passive range of motion    PT Next Visit Plan progress strengthening, work on technique on bike,  (straight leg dead lift, squats), work on mobility of spine.    PT Home Exercise Plan [id number]    Consulted and Agree with Plan of Care Patient           Patient will benefit from skilled therapeutic intervention in order to improve the following deficits and impairments:  Pain, Postural dysfunction, Decreased range of motion, Impaired flexibility, Hypomobility, Increased fascial restricitons, Decreased activity tolerance  Visit Diagnosis: Bilateral low back pain without sciatica, unspecified chronicity  Other symptoms and signs involving the musculoskeletal system  Pain in right hip     Problem List Patient Active Problem List   Diagnosis Date Noted  . Obesity 03/27/2019  . Allergic rhinitis 06/09/2016  . Ganglion cyst 03/20/2016  . Dyslipidemia 12/24/2014  . Vitamin D deficiency 12/24/2014  . Sleep apnea 12/22/2014  . Asthma 12/22/2014  . Dysphagia 12/22/2014    [place], PT 11/12/2019, 9:59 AM  [facility] [place], [place], [address] Phone: [phone]   Fax:  [phone]  Name: Jason Brock MRN: [id number] Date of Birth: 28-Sep-1965

## 2019-11-17 ENCOUNTER — Encounter: Payer: 59 | Admitting: Rehabilitative and Restorative Service Providers"

## 2019-11-19 ENCOUNTER — Encounter: Payer: 59 | Admitting: Rehabilitative and Restorative Service Providers"

## 2019-11-24 ENCOUNTER — Encounter: Payer: Self-pay | Admitting: Physical Therapy

## 2019-11-24 ENCOUNTER — Ambulatory Visit (INDEPENDENT_AMBULATORY_CARE_PROVIDER_SITE_OTHER): Payer: 59 | Admitting: Physical Therapy

## 2019-11-24 ENCOUNTER — Other Ambulatory Visit: Payer: Self-pay

## 2019-11-24 DIAGNOSIS — M25551 Pain in right hip: Secondary | ICD-10-CM | POA: Diagnosis not present

## 2019-11-24 DIAGNOSIS — M545 Low back pain, unspecified: Secondary | ICD-10-CM

## 2019-11-24 DIAGNOSIS — R29898 Other symptoms and signs involving the musculoskeletal system: Secondary | ICD-10-CM | POA: Diagnosis not present

## 2019-11-24 NOTE — Therapy (Signed)
[place], [place], [address] Phone: [phone]   Fax:  [phone]  Physical Therapy Treatment  Patient Details  Name: Jason Brock MRN: [id number] Date of Birth: March 14, 1965 Referring Provider (PT): Eunice Blase, MD   Encounter Date: 11/24/2019   PT End of Session - 11/24/19 0724    Visit Number 5    Number of Visits 12    Date for PT Re-Evaluation 12/01/19    PT Start Time 0719    PT Stop Time 0757    PT Time Calculation (min) 38 min    Activity Tolerance Patient tolerated treatment well    Behavior During Therapy [facility] for tasks assessed/performed           Past Medical History:  Diagnosis Date  . Allergy   . Asthma   . Moderate ulcerative colitis ([place]) 2009   Dr. Earle Gell colonoscopy report    History reviewed. No pertinent surgical history.  There were no vitals filed for this visit.   Subjective Assessment - 11/24/19 0719    Subjective Pt reports his low back feels a little worse.  He hasn't seen much change in his flexibility. He rode his bike this weekend and tried to work on his form (of back).    Currently in Pain? Yes    Pain Score 4     Pain Location Back    Pain Orientation Lower    Pain Descriptors / Indicators Aching    Aggravating Factors  standing for any length of time    Pain Relieving Factors sitting (takes pressure off)              [facility] PT Assessment - 11/24/19 0001      Assessment   Medical Diagnosis low back pain without sciatica    Referring Provider (PT) Eunice Blase, MD    Onset Date/Surgical Date 10/16/19    Hand Dominance Right            [facility] Adult PT Treatment/Exercise - 11/24/19 0001      Self-Care   Self-Care Other Self-Care Comments    Other Self-Care Comments  Pt educated of self-release to ant hips with ball; pt returned demo with cues.       Lumbar Exercises: Stretches   Hip Flexor Stretch Right;Left;2 reps;30  seconds   kneeling, arm overhead - 2 sets   Press Ups 10 seconds;5 reps    Other Lumbar Stretch Exercise High doorway stretch x 20 sec x 2 reps      Lumbar Exercises: Aerobic   Tread Mill 2.7 mph x 4.5 min for warm up.       Lumbar Exercises: Supine   Other Supine Lumbar Exercises leg/arm lengthener x 10 sec x 5 reps each side.     Other Supine Lumbar Exercises thoracic ext over full foam roller x 5 reps       Lumbar Exercises: Sidelying   Other Sidelying Lumbar Exercises open book with green band x 5 reps each side.       Lumbar Exercises: Quadruped   Other Quadruped Lumbar Exercises modified childs pose, rolling arms forward of full foarm roller x 10-15 sec x 3 reps               PT Long Term Goals - 11/24/19 0746      PT LONG TERM GOAL #1   Title The patient will be indep with HEP for LE strengthening.    Time 6  Period Weeks    Status On-going      PT LONG TERM GOAL #2   Title The patient will decrease functional limitation per FOTO from 29% to < or equal to 25%.    Time 6    Period Weeks    Status On-going      PT LONG TERM GOAL #3   Title The patient will report LBP with standing x 20 minutes < or equal to 2/10.    Time 6    Period Weeks    Status On-going      PT LONG TERM GOAL #4   Title The patient will transition sit>stand without pain in low back.    Time 6    Period Weeks    Status On-going                 Plan - 11/24/19 0747    Clinical Impression Statement Persistent tightness throughout thoracic to lumbar musculature. Pt reported reduction of pain/tightness with exercises performed today.  Encouraged pt to include ext based exercises (hips/ thoracic) into routine. Will include more hip hinge/neutral spine next visit.    Stability/Clinical Decision Making Stable/Uncomplicated    Rehab Potential Good    PT Frequency 2x / week    PT Duration 6 weeks    PT Treatment/Interventions ADLs/Self Care Home Management;Taping;Manual  techniques;Patient/family education;Dry needling;Therapeutic activities;Therapeutic exercise;Neuromuscular re-education;Electrical Stimulation;Cryotherapy;Moist Heat;Traction;Passive range of motion    PT Next Visit Plan progress strengthening, work on technique on bike,  (straight leg dead lift, squats), work on mobility of spine.    PT Home Exercise Plan [id number]    Consulted and Agree with Plan of Care Patient           Patient will benefit from skilled therapeutic intervention in order to improve the following deficits and impairments:  Pain, Postural dysfunction, Decreased range of motion, Impaired flexibility, Hypomobility, Increased fascial restricitons, Decreased activity tolerance  Visit Diagnosis: Bilateral low back pain without sciatica, unspecified chronicity  Other symptoms and signs involving the musculoskeletal system  Pain in right hip     Problem List Patient Active Problem List   Diagnosis Date Noted  . Obesity 03/27/2019  . Allergic rhinitis 06/09/2016  . Ganglion cyst 03/20/2016  . Dyslipidemia 12/24/2014  . Vitamin D deficiency 12/24/2014  . Sleep apnea 12/22/2014  . Asthma 12/22/2014  . Dysphagia 12/22/2014   Jason Brock, PTA 11/24/19 1:14 PM  [facility] [place], [place], [address] Phone: [phone]   Fax:  [phone]  Name: Jason Brock MRN: [id number] Date of Birth: 15-Dec-1965

## 2019-11-26 ENCOUNTER — Ambulatory Visit (INDEPENDENT_AMBULATORY_CARE_PROVIDER_SITE_OTHER): Payer: 59 | Admitting: Physical Therapy

## 2019-11-26 ENCOUNTER — Encounter: Payer: Self-pay | Admitting: Physical Therapy

## 2019-11-26 ENCOUNTER — Other Ambulatory Visit: Payer: Self-pay

## 2019-11-26 DIAGNOSIS — M545 Low back pain, unspecified: Secondary | ICD-10-CM

## 2019-11-26 DIAGNOSIS — R29898 Other symptoms and signs involving the musculoskeletal system: Secondary | ICD-10-CM | POA: Diagnosis not present

## 2019-11-26 DIAGNOSIS — M25551 Pain in right hip: Secondary | ICD-10-CM | POA: Diagnosis not present

## 2019-11-26 NOTE — Therapy (Signed)
[place], [place], [address] Phone: [phone]   Fax:  [phone]  Physical Therapy Treatment  Patient Details  Name: Jason Brock MRN: [id number] Date of Birth: 1965-08-17 Referring Provider (PT): Eunice Blase, MD   Encounter Date: 11/26/2019   PT End of Session - 11/26/19 0712    Visit Number 6    Number of Visits 12    Date for PT Re-Evaluation 12/01/19    PT Start Time 0715    PT Stop Time 0802    PT Time Calculation (min) 47 min    Activity Tolerance Patient tolerated treatment well    Behavior During Therapy [facility] for tasks assessed/performed           Past Medical History:  Diagnosis Date  . Allergy   . Asthma   . Moderate ulcerative colitis ([place]) 2009   Dr. Earle Gell colonoscopy report    History reviewed. No pertinent surgical history.  There were no vitals filed for this visit.   Subjective Assessment - 11/26/19 0725    Subjective Pt reports he bought a foam roller; it arrives today.  Yesterday he had some localized pain in Rt SI joint.  This morning he awoke with no pain.    Patient Stated Goals Be able to stand for any period of time without low back pain.    Currently in Pain? No/denies    Pain Score 0-No pain    Pain Location Back              [facility] PT Assessment - 11/26/19 0001      Assessment   Medical Diagnosis low back pain without sciatica    Referring Provider (PT) Eunice Blase, MD    Onset Date/Surgical Date 10/16/19    Hand Dominance Right    Next MD Visit PRN           [facility] Adult PT Treatment/Exercise - 11/26/19 0001      Lumbar Exercises: Stretches   Passive Hamstring Stretch Right;Left;30 seconds   standing, foot on black mat. cues to not bounce   Hip Flexor Stretch Right;Left;2 reps;30 seconds   kneeling, arm overhead     Lumbar Exercises: Aerobic   Tread Mill 2.8 mph x 4.5 min for warm up.       Lumbar Exercises: Supine    Dead Bug 10 reps   hooklying on full foam roller    Other Supine Lumbar Exercises thoracic ext over full foam roller x 5 reps.  Hooklying on full foam roller- prolonged stretch arms abdct 90 deg x 30 sec then 5 active snow angels.       Lumbar Exercises: Sidelying   Other Sidelying Lumbar Exercises open book with green band x 5 reps each side.       Lumbar Exercises: Prone   Other Prone Lumbar Exercises pelvic press x 3 sec x 3 reps; then pelvic press with unilateral knee bend x 5 each, bilat knee x 5 reps, hip ext knee straight x 5 - cat / cow in between exercises for stretch     Other Prone Lumbar Exercises goal post scap retraction, lifiting arms off table x 10 (2 reps with axial ext)       Lumbar Exercises: Quadruped   Other Quadruped Lumbar Exercises modified childs pose, rolling arms forward of full foarm roller x 10-15 sec x 3 reps  PT Long Term Goals - 11/26/19 0736      PT LONG TERM GOAL #1   Title The patient will be indep with HEP for LE strengthening.    Time 6    Period Weeks    Status On-going      PT LONG TERM GOAL #2   Title The patient will decrease functional limitation per FOTO from 29% to < or equal to 25%.    Time 6    Period Weeks    Status On-going      PT LONG TERM GOAL #3   Title The patient will report LBP with standing x 20 minutes < or equal to 2/10.    Time 6    Period Weeks    Status On-going      PT LONG TERM GOAL #4   Title The patient will transition sit>stand without pain in low back.    Time 6    Period Weeks    Status Partially Met                 Plan - 11/26/19 1401    Clinical Impression Statement Pt tolerated stretches from last session well.  Pt shown other exercises with foam roller for core work and flexibility.  Pt tolerated pelvic press series well, without pain or difficulty; added to HEP.  Pt unable to secure appt for next week, so he plans to continue working on HEP and return in 2  wks with feedback of how HEP is going with new stretches and low back strengthing.  Pt making gradual progress towards goals.    Stability/Clinical Decision Making Stable/Uncomplicated    Rehab Potential Good    PT Frequency 2x / week    PT Duration 6 weeks    PT Treatment/Interventions ADLs/Self Care Home Management;Taping;Manual techniques;Patient/family education;Dry needling;Therapeutic activities;Therapeutic exercise;Neuromuscular re-education;Electrical Stimulation;Cryotherapy;Moist Heat;Traction;Passive range of motion    PT Next Visit Plan progress postural strengthening;  work on mobility of spine.  Modify HEP as needed.    PT Home Exercise Plan [id number]    Consulted and Agree with Plan of Care Patient           Patient will benefit from skilled therapeutic intervention in order to improve the following deficits and impairments:  Pain, Postural dysfunction, Decreased range of motion, Impaired flexibility, Hypomobility, Increased fascial restricitons, Decreased activity tolerance  Visit Diagnosis: Bilateral low back pain without sciatica, unspecified chronicity  Other symptoms and signs involving the musculoskeletal system  Pain in right hip     Problem List Patient Active Problem List   Diagnosis Date Noted  . Obesity 03/27/2019  . Allergic rhinitis 06/09/2016  . Ganglion cyst 03/20/2016  . Dyslipidemia 12/24/2014  . Vitamin D deficiency 12/24/2014  . Sleep apnea 12/22/2014  . Asthma 12/22/2014  . Dysphagia 12/22/2014   Kerin Perna, PTA 11/26/19 2:03 PM  [place], [place], [address] Phone: [phone]   Fax:  [phone]  Name: Jason Brock MRN: [id number] Date of Birth: 1965-04-12

## 2019-11-26 NOTE — Patient Instructions (Signed)
Pelvic Press     Place hands under belly between navel and pubic bone, palms up. Feel pressure on hands. Increase pressure on hands by pressing pelvis down. This is NOT a pelvic tilt. Hold __5_ seconds. Relax. Repeat _10__ times. Once a day.  KNEE: Flexion - Prone   Hold pelvic press. Bend knee, then return the foot down. Repeat on opposite leg. Do not raise hips. _10__ reps per set. When this is mastered, pull both heels up at same time, x 10 reps.  3 x /wk   Leg Lift: One-Leg   Press pelvis down. Keep knee straight; lengthen and lift one leg (from waist). Do not twist body. Keep other leg down. Hold _1__ seconds. Relax. Repeat 10 time. Repeat with other leg.  HIP: Extension / KNEE: Flexion - Prone    Hold pelvic press. Bend knee, squeeze glutes. Raise leg up  10___ reps per set, _1__ sets per day, _1__ time a day.   Axial Extension- Upper body sequence * always start with pelvic press    Lie on stomach with forehead resting on floor and arms at sides. Tuck chin in and raise head from floor without bending it up or down. Repeat ___10_ times per set. Do __1__ sets per session. Do _1___ sessions per day.  Scapular Retraction: Abduction (Prone)    Lie with upper arms straight out from sides, elbows bent to 90. Pinch shoulder blades together and raise arms a few inches from floor. Repeat __10__ times per set. Do _1___ sets per session. Do ____ sessions per day.   Arm Sweep: Angels in the [place]    Get ON TARGET. Lie on flat up roller, feet on full roller. Keeping arms on floor, sweep out and up beyond head. Stop and stretch as tightness develops. Hold _30-60__ seconds.  Kneeling With Foam Roller    Kneel, hands on full roller. Inhale, lean hips backward, buttocks toward heels and extend arms forward on roller. Exhale and elongate spine. Feel stretch in back, hips, knees and arms. Hold _15__ seconds. Repeat _3-5__ times per session.

## 2019-12-10 ENCOUNTER — Ambulatory Visit (INDEPENDENT_AMBULATORY_CARE_PROVIDER_SITE_OTHER): Payer: 59 | Admitting: Physical Therapy

## 2019-12-10 ENCOUNTER — Other Ambulatory Visit: Payer: Self-pay

## 2019-12-10 DIAGNOSIS — R29898 Other symptoms and signs involving the musculoskeletal system: Secondary | ICD-10-CM | POA: Diagnosis not present

## 2019-12-10 DIAGNOSIS — M545 Low back pain, unspecified: Secondary | ICD-10-CM | POA: Diagnosis not present

## 2019-12-10 NOTE — Therapy (Addendum)
[facility] [place], [place], [address] Phone: [phone]   Fax:  [phone]  Physical Therapy Treatment and On Hold Note/ Discharge Summary Patient Details  Name: Jason Brock MRN: [id number] Date of Birth: 23-Sep-1965 Referring Provider (PT): Eunice Blase, MD   Encounter Date: 12/10/2019   PT End of Session - 12/10/19 0817    Visit Number 7    Number of Visits 12    PT Start Time 9563    PT Stop Time 0800    PT Time Calculation (min) 43 min    Activity Tolerance Patient tolerated treatment well    Behavior During Therapy [facility] for tasks assessed/performed           Past Medical History:  Diagnosis Date  . Allergy   . Asthma   . Moderate ulcerative colitis ([place]) 2009   Dr. Earle Gell colonoscopy report    No past surgical history on file.  There were no vitals filed for this visit.   Subjective Assessment - 12/10/19 0721    Subjective Pt has been on inversion table, for a few min at a time. "It seems to be working". He has been using foam roller and stretching. He hasn't rode his bike in over a week and he wonders if that is why his back is feeling some better.    Currently in Pain? Yes    Pain Score 1     Pain Location Back    Pain Orientation Lower    Pain Descriptors / Indicators Dull    Aggravating Factors  standing    Pain Relieving Factors sitting              [facility] PT Assessment - 12/10/19 0001      Assessment   Medical Diagnosis low back pain without sciatica    Referring Provider (PT) Eunice Blase, MD    Onset Date/Surgical Date 10/16/19    Hand Dominance Right    Next MD Visit PRN      Observation/Other Assessments   Focus on Therapeutic Outcomes (FOTO)  6% limitation             [facility] Adult PT Treatment/Exercise - 12/10/19 0001      Self-Care   Other Self-Care Comments  reviewed self release work to ant hip with ball when pt is prone x 4 min        Lumbar Exercises: Stretches   Passive Hamstring Stretch Right;Left;2 reps;20 seconds   seated with hip hinge   Hip Flexor Stretch Right;Left;2 reps;30 seconds   kneeling, arm overhead   Press Ups 3 reps;5 seconds    Quad Stretch Right;Left;1 rep;30 seconds   prone with strap, noodle above knee     Lumbar Exercises: Aerobic   Tread Mill 3.0 mph x 4.5 min for warm up.       Lumbar Exercises: Seated   Other Seated Lumbar Exercises arms on physioball, rolling forward with lateral trunk flexion x 20 sec each direciton      Lumbar Exercises: Supine   Other Supine Lumbar Exercises hooklying on full foam roller:  snow angels x 5 reps;  diagonals with green band x 10 each arm, bilat horiz abdct with green band x 10 and core engaged.       Lumbar Exercises: Prone   Other Prone Lumbar Exercises pelvic press with bilat knee flex x 5, unilateral knee flexion x 5 each leg.     Other Prone Lumbar Exercises goal  post scap retraction, lifiting arms off table x 10       Lumbar Exercises: Quadruped   Madcat/Old Horse 5 reps    Other Quadruped Lumbar Exercises modified childs pose, rolling arms forward of full foarm roller x 10-15 sec x 3 reps                        PT Long Term Goals - 12/10/19 0726      PT LONG TERM GOAL #1   Title The patient will be indep with HEP for LE strengthening.    Time 6    Period Weeks    Status Partially Met      PT LONG TERM GOAL #2   Title The patient will decrease functional limitation per FOTO from 29% to < or equal to 25%.    Time 6    Period Weeks    Status Achieved      PT LONG TERM GOAL #3   Title The patient will report LBP with standing x 20 minutes < or equal to 2/10.    Baseline Pain can vary from 2-7/10; side stretching in standing helps reduce pain to 2/10.    Time 6    Period Weeks    Status Partially Met      PT LONG TERM GOAL #4   Title The patient will transition sit>stand without pain in low back.    Time 6    Period Weeks     Status Achieved             UPDATED LONG TERM GOALS:  PT Long Term Goals - 12/10/19 1405      PT LONG TERM GOAL #1   Title The patient will be indep with progression of HEP.    Time 4    Period Weeks    Status Revised    Target Date 01/09/20      PT LONG TERM GOAL #2   Title The patient will report LBP with standing x 20 minutes < or equal to 2/10.    Baseline Pain can vary from 2-7/10; side stretching in standing helps reduce pain to 2/10.    Time 4    Period Weeks    Status Revised    Target Date 01/09/20               Plan - 12/10/19 0817    Clinical Impression Statement Pt has had positive response with the stretches issued in HEP, along with foam roller use and now the addition of using inversion table.  Pt waking with less pain. Pt  able to stand longer if he completes side stretch when pain rises. Reviewed HEP components including ball release, strengthening LB exercises and stretches.  All completed without any increase in LBP.  Pt has partially met his goals and is agreeable to hold therapy while he continues to work on [employer].  Pt to return in next 30 days if in need of exercise progression, or if he has a flare up of symptoms.    Stability/Clinical Decision Making Stable/Uncomplicated    Rehab Potential Good    PT Frequency 2x / week    PT Duration 6 weeks    PT Treatment/Interventions ADLs/Self Care Home Management;Taping;Manual techniques;Patient/family education;Dry needling;Therapeutic activities;Therapeutic exercise;Neuromuscular re-education;Electrical Stimulation;Cryotherapy;Moist Heat;Traction;Passive range of motion    PT Next Visit Plan spoke to supervising PT; will hold therapy until 01/09/20.    [place] [phone]  Consulted and Agree with Plan of Care Patient           Patient will benefit from skilled therapeutic intervention in order to improve the following deficits and impairments:  Pain, Postural dysfunction, Decreased range of  motion, Impaired flexibility, Hypomobility, Increased fascial restricitons, Decreased activity tolerance  Visit Diagnosis: Bilateral low back pain without sciatica, unspecified chronicity  Other symptoms and signs involving the musculoskeletal system     Problem List Patient Active Problem List   Diagnosis Date Noted  . Obesity 03/27/2019  . Allergic rhinitis 06/09/2016  . Ganglion cyst 03/20/2016  . Dyslipidemia 12/24/2014  . Vitamin D deficiency 12/24/2014  . Sleep apnea 12/22/2014  . Asthma 12/22/2014  . Dysphagia 12/22/2014    PHYSICAL THERAPY DISCHARGE SUMMARY  Visits from Start of Care: 7  Current functional level related to goals / functional outcomes: See goals above   Remaining deficits: Patient on hold to continue working on HEP.  Has not returned to PT   Education / Equipment: Comprehensive HEP  Plan: Patient agrees to discharge.  Patient goals were met. Patient is being discharged due to meeting the stated rehab goals.  ?????        Thank you for the referral of this patient. Rudell Cobb, MPT  Kerin Perna, PTA 12/10/19 8:28 AM   Rudell Cobb, PT  [facility] [place], [place], [address] Phone: [phone]   Fax:  [phone]  Name: Jason Brock MRN: [id number] Date of Birth: 1965/02/13

## 2019-12-15 ENCOUNTER — Encounter: Payer: 59 | Admitting: Rehabilitative and Restorative Service Providers"

## 2019-12-17 ENCOUNTER — Encounter: Payer: 59 | Admitting: Rehabilitative and Restorative Service Providers"

## 2020-03-09 ENCOUNTER — Other Ambulatory Visit: Payer: Self-pay

## 2020-03-09 ENCOUNTER — Emergency Department
Admission: EM | Admit: 2020-03-09 | Discharge: 2020-03-09 | Disposition: A | Discharge status: Home / Self Care | Payer: 59 | Source: Home / Self Care

## 2020-03-09 DIAGNOSIS — R197 Diarrhea, unspecified: Secondary | ICD-10-CM

## 2020-03-09 MED ORDER — CIPROFLOXACIN HCL 500 MG PO TABS: 500.0000 mg | ORAL_TABLET | Freq: Two times a day (BID) | ORAL | 0 refills | Status: DC

## 2020-03-09 MED ORDER — DIPHENOXYLATE-ATROPINE 2.5-0.025 MG PO TABS: 1.0000 | ORAL_TABLET | Freq: Four times a day (QID) | ORAL | 0 refills | Status: DC | PRN

## 2020-03-09 NOTE — ED Triage Notes (Signed)
Pt c/o diarrhea since Friday. Says he ate some salmon in the fridge that had been in there longer than he thought. Diarrhea started later that evening. Was resolving Sunday but has worsened in last 24 hours. Pepto Sunday. Then yesterday tried OTC antidiarrheal and Equate probiotics prn.

## 2020-03-09 NOTE — ED Provider Notes (Signed)
[name]    CSN: [id number] Arrival date & time: 03/09/20  2482      History   Chief Complaint Chief Complaint  Patient presents with  . Diarrhea    HPI Jason Brock is a 55 y.o. male.   HPI   Patient believes that he ate something that did not agree with him on Thursday and has had watery diarrhea since Friday.  He states he has cramps and diarrhea that is unremitting.  He states that he has been able to keep up with his fluids and Gatorade.  He has been taking over-the-counter diarrhea medication.  In spite of this he continues to have abdominal crampy pain and profuse watery diarrhea.  No blood in his bowels.  No mucus. He has a past history of diarrhea over 20 years ago and was told that he may have Crohn's disease or colitis.  He was treated for well.  He then followed up with 2 additional colonoscopies that did not show Crohn's disease and was told that he did not have a colitis.  He has polyps in his colons but no known diverticular disease or inflammatory bowel disease Patient states his symptoms started after he ate some salmon that may have been in his refrigerator for 2-long He did do a Covid test at home and it was negative.  He is Covid vaccinated  Past Medical History:  Diagnosis Date  . Allergy   . Asthma   . Moderate ulcerative colitis ([place]) 2009   Dr. Earle Gell colonoscopy report    Patient Active Problem List   Diagnosis Date Noted  . Obesity 03/27/2019  . Allergic rhinitis 06/09/2016  . Ganglion cyst 03/20/2016  . Dyslipidemia 12/24/2014  . Vitamin D deficiency 12/24/2014  . Sleep apnea 12/22/2014  . Asthma 12/22/2014  . Dysphagia 12/22/2014    History reviewed. No pertinent surgical history.     Home Medications    Prior to Admission medications   Medication Sig Start Date End Date Taking? Authorizing Provider  ciprofloxacin (CIPRO) 500 MG tablet Take 1 tablet (500 mg total) by mouth 2 (two) times daily. 03/09/20  Yes  Raylene Everts, MD  diphenoxylate-atropine (LOMOTIL) 2.5-0.025 MG tablet Take 1 tablet by mouth 4 (four) times daily as needed for diarrhea or loose stools. 03/09/20  Yes Raylene Everts, MD  acetaminophen (TYLENOL) 500 MG tablet Take 1,000 mg by mouth every 6 (six) hours as needed for moderate pain or headache.    [provider]  AMBULATORY NON FORMULARY MEDICATION Continuous positive airway pressure (CPAP) titrate setting per protcal of H2O pressure, with all supplemental supplies as needed. 01/15/15   Gregor Hams, MD  Ascorbic Acid (VITAMIN C) 1000 MG tablet Take 2,000 mg by mouth daily.    [provider]  atorvastatin (LIPITOR) 10 MG tablet Take 1 tablet (10 mg total) by mouth daily. 05/20/19   Hilts, Legrand Como, MD  Cholecalciferol (VITAMIN D3) 2000 units capsule Take 2,000 Units by mouth daily.    [provider]  fluticasone (FLONASE) 50 MCG/ACT nasal spray Place into both nostrils daily.    [provider]  fluticasone furoate-vilanterol (BREO ELLIPTA) 200-25 MCG/INH AEPB Inhale 1 puff into the lungs daily. 05/22/18   Gregor Hams, MD  ibuprofen (ADVIL,MOTRIN) 200 MG tablet Take 400-800 mg by mouth every 6 (six) hours as needed (inflammation).    [provider]  levocetirizine (XYZAL) 5 MG tablet TAKE 1 TABLET (5 MG TOTAL) BY MOUTH EVERY EVENING.  Patient taking differently: Take 5 mg by mouth daily.  12/13/15   Gregor Hams, MD  montelukast (SINGULAIR) 10 MG tablet Take 10 mg by mouth daily. 03/26/19   [provider]  Multiple Vitamins-Minerals (MULTIVITAMIN ADULT PO) Take 1 tablet by mouth daily.     [provider]  Omega-3 Fatty Acids (FISH OIL) 1200 MG CAPS Take 2,400 mg by mouth 2 (two) times daily.    [provider]  omeprazole (PRILOSEC) 40 MG capsule TAKE 1 CAPSULE (40 MG TOTAL) BY MOUTH DAILY. PLEASE SCHEDULE AN OFFICE VISIT WITH DR. Havery Moros FOR FURTHER REFILLS: [phone] 10/21/18   Armbruster, Carlota Raspberry, MD  Ipratropium-Albuterol (COMBIVENT RESPIMAT) 20-100 MCG/ACT AERS respimat Inhale 1 puff into the lungs every 6 (six) hours. Patient not taking: Reported on 10/16/2019 05/10/18 03/09/20  Gregor Hams, MD    Family History Family History  Problem Relation Age of Onset  . Cancer Mother   . Heart disease Father   . Depression Brother     Social History Social History   Tobacco Use  . Smoking status: Never Smoker  . Smokeless tobacco: Former Systems developer    Types: Snuff  Vaping Use  . Vaping Use: Never used  Substance Use Topics  . Alcohol use: Yes    Alcohol/week: 0.0 - 10.0 standard drinks  . Drug use: No     Allergies   Dust mite extract, Bee venom, Pollen extract, and Peanut (diagnostic)   Review of Systems Review of Systems See HPI  Physical Exam Triage Vital Signs ED Triage Vitals  Enc Vitals Group     BP 03/09/20 0851 119/90     Pulse Rate 03/09/20 0851 82     Resp 03/09/20 0851 17     Temp 03/09/20 0851 98.2 F (36.8 C)     Temp Source 03/09/20 0851 Oral     SpO2 03/09/20 0851 99 %     Weight --      Height --      Head Circumference --      Peak Flow --      Pain Score 03/09/20 0854 0     Pain Loc --      Pain Edu? --      Excl. in [place]? --    No data found.  Updated Vital Signs BP 119/90 (BP Location: Left Arm)   Pulse 82   Temp 98.2 F (36.8 C) (Oral)   Resp 17   SpO2 99%        Physical Exam Constitutional:      General: He is not in acute distress.    Appearance: He is well-developed, normal weight and well-nourished. He is not ill-appearing.  HENT:     Head: Normocephalic and atraumatic.     Mouth/Throat:     Mouth: Oropharynx is clear and moist. Mucous membranes are moist.     Pharynx: No posterior oropharyngeal erythema.  Eyes:     Conjunctiva/sclera: Conjunctivae normal.     Pupils: Pupils are equal, round, and reactive to light.  Cardiovascular:     Rate and Rhythm: Normal rate and regular rhythm.     Heart sounds: Normal heart  sounds.  Pulmonary:     Effort: Pulmonary effort is normal. No respiratory distress.     Breath sounds: Normal breath sounds.  Abdominal:     General: Abdomen is flat. Bowel sounds are normal. There is no distension.     Palpations: Abdomen is soft.  Tenderness: There is abdominal tenderness.     Comments: Abdomen is flat.  Active bowel sounds.  Diffusely tender to deep palpation with no area of acute tenderness.  No organomegaly  Musculoskeletal:        General: No edema. Normal range of motion.     Cervical back: Normal range of motion.  Skin:    General: Skin is warm and dry.  Neurological:     Mental Status: He is alert.  Psychiatric:        Behavior: Behavior normal.      UC Treatments / Results  Labs (all labs ordered are listed, but only abnormal results are displayed) Labs Reviewed - No data to display  EKG   Radiology No results found.  Procedures Procedures (including critical care time)  Medications Ordered in UC Medications - No data to display  Initial Impression / Assessment and Plan / UC Course  I have reviewed the triage vital signs and the nursing notes.  Pertinent labs & imaging results that were available during my care of the patient were reviewed by me and considered in my medical decision making (see chart for details).     Reviewed testing and treatment for diarrhea.  Gastric pathogen panel can be done, but is costly and time-consuming.  I recommend a trial of Cipro and Lomotil with a bland diet and clear liquids.  If he does not see improvement within a couple of days then additional testing can be done.  He talked about irritation around his rectum from all of the diarrhea.  I recommend he get a baby diaper rash cream return here as needed Final Clinical Impressions(s) / UC Diagnoses   Final diagnoses:  Acute diarrhea     Discharge Instructions     Continue to drink plenty of water/Gatorade/electrolyte replacement May eat bland diet  avoid fried or spicy foods Take Lomotil as needed for diarrhea Take the Cipro two times a day for 5 to 7 days (until diarrhea stops) Call or return if not improving by the end of the week here or your primary care doctor   ED Prescriptions    Medication Sig Dispense Auth. Provider   diphenoxylate-atropine (LOMOTIL) 2.5-0.025 MG tablet Take 1 tablet by mouth 4 (four) times daily as needed for diarrhea or loose stools. 20 tablet Raylene Everts, MD   ciprofloxacin (CIPRO) 500 MG tablet Take 1 tablet (500 mg total) by mouth 2 (two) times daily. 14 tablet Raylene Everts, MD     PDMP not reviewed this encounter.   Raylene Everts, MD 03/09/20 [phone]

## 2020-03-09 NOTE — Discharge Instructions (Signed)
Continue to drink plenty of water/Gatorade/electrolyte replacement May eat bland diet avoid fried or spicy foods Take Lomotil as needed for diarrhea Take the Cipro two times a day for 5 to 7 days (until diarrhea stops) Call or return if not improving by the end of the week here or your primary care doctor

## 2020-05-19 ENCOUNTER — Other Ambulatory Visit: Payer: Self-pay | Admitting: Family Medicine

## 2020-06-22 ENCOUNTER — Emergency Department
Admission: EM | Admit: 2020-06-22 | Discharge: 2020-06-22 | Disposition: A | Discharge status: Home / Self Care | Payer: 59 | Source: Home / Self Care

## 2020-06-22 ENCOUNTER — Other Ambulatory Visit: Payer: Self-pay

## 2020-06-22 DIAGNOSIS — J3489 Other specified disorders of nose and nasal sinuses: Secondary | ICD-10-CM | POA: Diagnosis not present

## 2020-06-22 DIAGNOSIS — J309 Allergic rhinitis, unspecified: Secondary | ICD-10-CM | POA: Diagnosis not present

## 2020-06-22 DIAGNOSIS — J0100 Acute maxillary sinusitis, unspecified: Secondary | ICD-10-CM | POA: Diagnosis not present

## 2020-06-22 DIAGNOSIS — R059 Cough, unspecified: Secondary | ICD-10-CM

## 2020-06-22 MED ORDER — AMOXICILLIN-POT CLAVULANATE 875-125 MG PO TABS: 1.0000 | ORAL_TABLET | Freq: Two times a day (BID) | ORAL | 0 refills | Status: DC

## 2020-06-22 MED ORDER — METHYLPREDNISOLONE ACETATE 80 MG/ML IJ SUSP
80.0000 mg | Freq: Once | INTRAMUSCULAR | Status: AC
  Administered 2020-06-22: 80 mg via INTRAMUSCULAR

## 2020-06-22 MED ORDER — FEXOFENADINE HCL 180 MG PO TABS: 180.0000 mg | ORAL_TABLET | Freq: Every day | ORAL | 0 refills | Status: DC

## 2020-06-22 MED ORDER — PREDNISONE 20 MG PO TABS: ORAL_TABLET | ORAL | 0 refills | Status: DC

## 2020-06-22 NOTE — Discharge Instructions (Signed)
Advised/instructed patient to take medication as directed with food to completion.  Advised patient to hold Flonase and Xyzal for the next 7-10 days. Advised patient may take Allegra 180 mg daily for the next 5 days, then as needed.  Instructed patient to start oral prednisone burst tomorrow, Wednesday, 06/23/2020.  Encourage patient increase daily water intake while taking these medications.

## 2020-06-22 NOTE — ED Triage Notes (Signed)
Pt presents to Urgent Care with c/o runny nose/congestion, fatigue, eye drainage, and intermittent fever x 5 days . Has taken 3 home COVID tests which were negative, but stepdaughter, who he was around 2 days ago, tested positive for COVID; pt has been vaccinated.

## 2020-06-22 NOTE — ED Provider Notes (Signed)
Old male [name] C[facility]    CSN: [id number] Arrival date & time: 06/22/20  1039      History   Chief Complaint Chief Complaint  Patient presents with  . Nasal Congestion  . Fever    HPI Baily Serpe is a 55 y.o. male.   HPI presents with runny nose, nasal congestion fatigue, eye drainage and intermittent fever x5 days.  Patient reports negative x3 on home COVID-19 test.  Patient reports being exposed to COVID-19 2 days ago, as his step daughter tested positive for COVID-19.  Past Medical History:  Diagnosis Date  . Allergy   . Asthma   . Moderate ulcerative colitis ([place]) 2009   Dr. Earle Gell colonoscopy report    Patient Active Problem List   Diagnosis Date Noted  . Obesity 03/27/2019  . Allergic rhinitis 06/09/2016  . Ganglion cyst 03/20/2016  . Dyslipidemia 12/24/2014  . Vitamin D deficiency 12/24/2014  . Sleep apnea 12/22/2014  . Asthma 12/22/2014  . Dysphagia 12/22/2014    Past Surgical History:  Procedure Laterality Date  . NASAL SEPTOPLASTY W/ TURBINOPLASTY         Home Medications    Prior to Admission medications   Medication Sig Start Date End Date Taking? Authorizing Provider  amoxicillin-clavulanate (AUGMENTIN) 875-125 MG tablet Take 1 tablet by mouth every 12 (twelve) hours. 06/22/20  Yes Eliezer Lofts, FNP  fexofenadine [facility] ALLERGY) 180 MG tablet Take 1 tablet (180 mg total) by mouth daily for 15 days. 06/22/20 07/07/20 Yes Eliezer Lofts, FNP  predniSONE (DELTASONE) 20 MG tablet Take 3 tabs PO daily x 5 days. 06/22/20  Yes Eliezer Lofts, FNP  acetaminophen (TYLENOL) 500 MG tablet Take 1,000 mg by mouth every 6 (six) hours as needed for moderate pain or headache.    [provider]  AMBULATORY NON FORMULARY MEDICATION Continuous positive airway pressure (CPAP) titrate setting per protcal of H2O pressure, with all supplemental supplies as needed. 01/15/15   Gregor Hams, MD  Ascorbic Acid (VITAMIN C) 1000 MG tablet Take  2,000 mg by mouth daily.    [provider]  atorvastatin (LIPITOR) 10 MG tablet TAKE 1 TABLET BY MOUTH EVERY DAY 05/19/20   Hilts, Legrand Como, MD  Cholecalciferol (VITAMIN D3) 2000 units capsule Take 2,000 Units by mouth daily.    [provider]  ciprofloxacin (CIPRO) 500 MG tablet Take 1 tablet (500 mg total) by mouth 2 (two) times daily. 03/09/20   Raylene Everts, MD  diphenoxylate-atropine (LOMOTIL) 2.5-0.025 MG tablet Take 1 tablet by mouth 4 (four) times daily as needed for diarrhea or loose stools. 03/09/20   Raylene Everts, MD  fluticasone [facility]) 50 MCG/ACT nasal spray Place into both nostrils daily.    [provider]  fluticasone furoate-vilanterol (BREO ELLIPTA) 200-25 MCG/INH AEPB Inhale 1 puff into the lungs daily. 05/22/18   Gregor Hams, MD  ibuprofen (ADVIL,MOTRIN) 200 MG tablet Take 400-800 mg by mouth every 6 (six) hours as needed (inflammation).    [provider]  levocetirizine (XYZAL) 5 MG tablet TAKE 1 TABLET (5 MG TOTAL) BY MOUTH EVERY EVENING. Patient taking differently: Take 5 mg by mouth daily.  12/13/15   Gregor Hams, MD  montelukast (SINGULAIR) 10 MG tablet Take 10 mg by mouth daily. 03/26/19   [provider]  Multiple Vitamins-Minerals (MULTIVITAMIN ADULT PO) Take 1 tablet by mouth daily.     [provider]  Omega-3 Fatty Acids (FISH OIL) 1200 MG CAPS Take 2,400 mg  by mouth 2 (two) times daily.    [provider]  omeprazole (PRILOSEC) 40 MG capsule TAKE 1 CAPSULE (40 MG TOTAL) BY MOUTH DAILY. PLEASE SCHEDULE AN OFFICE VISIT WITH DR. Havery Moros FOR FURTHER REFILLS: [phone] 10/21/18   Armbruster, Carlota Raspberry, MD  Ipratropium-Albuterol (COMBIVENT RESPIMAT) 20-100 MCG/ACT AERS respimat Inhale 1 puff into the lungs every 6 (six) hours. Patient not taking: Reported on 10/16/2019 05/10/18 03/09/20  Gregor Hams, MD    Family History Family History  Problem Relation Age of Onset  . Cancer Mother   .  Heart disease Father   . Depression Brother     Social History Social History   Tobacco Use  . Smoking status: Never Smoker  . Smokeless tobacco: Former Systems developer    Types: Snuff  Vaping Use  . Vaping Use: Never used  Substance Use Topics  . Alcohol use: Yes    Alcohol/week: 4.0 - 8.0 standard drinks    Types: 4 - 8 Standard drinks or equivalent per week    Comment: weekly  . Drug use: No     Allergies   Dust mite extract, Bee venom, Pollen extract, and Peanut (diagnostic)   Review of Systems Review of Systems  Constitutional: Positive for fatigue and fever.  HENT: Positive for congestion and rhinorrhea.   Eyes: Negative.   Respiratory: Positive for cough.   Cardiovascular: Negative.   Gastrointestinal: Negative.   Genitourinary: Negative.   Musculoskeletal: Negative.   Skin: Negative.   Neurological: Negative.      Physical Exam Triage Vital Signs ED Triage Vitals  Enc Vitals Group     BP 06/22/20 1100 (!) 138/92     Pulse Rate 06/22/20 1100 70     Resp 06/22/20 1100 20     Temp 06/22/20 1100 98.9 F (37.2 C)     Temp Source 06/22/20 1100 Oral     SpO2 06/22/20 1100 98 %     Weight 06/22/20 1054 212 lb (96.2 kg)     Height 06/22/20 1054 5' 9"  (1.753 m)     Head Circumference --      Peak Flow --      Pain Score 06/22/20 1054 0     Pain Loc --      Pain Edu? --      Excl. in [place]? --    No data found.  Updated Vital Signs BP (!) 138/92 (BP Location: Right Arm)   Pulse 70   Temp 98.9 F (37.2 C) (Oral)   Resp 20   Ht 5' 9"  (1.753 m)   Wt 212 lb (96.2 kg)   SpO2 98%   BMI 31.31 kg/m   Physical Exam Vitals and nursing note reviewed.  Constitutional:      General: He is not in acute distress.    Appearance: Normal appearance. He is normal weight. He is ill-appearing.  HENT:     Head: Normocephalic and atraumatic.     Right Ear: Tympanic membrane, ear canal and external ear normal.     Left Ear: Tympanic membrane, ear canal and external ear  normal.     Nose: Congestion present. No rhinorrhea.     Comments: Turbinates are erythematous, with mucopurulent rhinorrhea noted    Mouth/Throat:     Mouth: Mucous membranes are moist.     Pharynx: Oropharynx is clear. No oropharyngeal exudate or posterior oropharyngeal erythema.     Comments: Moderate amount of clear of posterior oropharynx noted Eyes:     Extraocular  Movements: Extraocular movements intact.     Conjunctiva/sclera: Conjunctivae normal.     Pupils: Pupils are equal, round, and reactive to light.  Cardiovascular:     Rate and Rhythm: Normal rate and regular rhythm.     Pulses: Normal pulses.     Heart sounds: Normal heart sounds.  Pulmonary:     Effort: Pulmonary effort is normal.     Breath sounds: Normal breath sounds. No wheezing, rhonchi or rales.     Comments: Infrequent nonproductive cough noted on exam Musculoskeletal:        General: Normal range of motion.     Cervical back: Normal range of motion and neck supple. Tenderness present.  Lymphadenopathy:     Cervical: Cervical adenopathy present.  Skin:    General: Skin is warm and dry.  Neurological:     Mental Status: He is alert.      UC Treatments / Results  Labs (all labs ordered are listed, but only abnormal results are displayed) Labs Reviewed  COVID-19, FLU A+B NAA    EKG   Radiology No results found.  Procedures Procedures (including critical care time)  Medications Ordered in UC Medications  methylPREDNISolone acetate (DEPO-MEDROL) injection 80 mg (has no administration in time range)    Initial Impression / Assessment and Plan / UC Course  I have reviewed the triage vital signs and the nursing notes.  Pertinent labs & imaging results that were available during my care of the patient were reviewed by me and considered in my medical decision making (see chart for details).     MDM: 1.  Acute maxillary sinusitis, 2.  Sinus pressure, 3.  Cough, 4.  Allergic rhinitis.  Patient  discharged home, hemodynamically stable Final Clinical Impressions(s) / UC Diagnoses   Final diagnoses:  Cough  Acute maxillary sinusitis, recurrence not specified  Sinus pressure  Allergic rhinitis, unspecified seasonality, unspecified trigger     Discharge Instructions     Advised/instructed patient to take medication as directed with food to completion.  Advised patient to hold Flonase and Xyzal for the next 7-10 days. Advised patient may take Allegra 180 mg daily for the next 5 days, then as needed.  Instructed patient to start oral prednisone burst tomorrow, Wednesday, 06/23/2020.  Encourage patient increase daily water intake while taking these medications.    ED Prescriptions    Medication Sig Dispense Auth. Provider   amoxicillin-clavulanate (AUGMENTIN) 875-125 MG tablet Take 1 tablet by mouth every 12 (twelve) hours. 14 tablet Eliezer Lofts, FNP   fexofenadine [facility] ALLERGY) 180 MG tablet Take 1 tablet (180 mg total) by mouth daily for 15 days. 15 tablet Eliezer Lofts, FNP   predniSONE (DELTASONE) 20 MG tablet Take 3 tabs PO daily x 5 days. 15 tablet Eliezer Lofts, FNP     PDMP not reviewed this encounter.   Osric, [place], [place] 06/22/20 1247

## 2020-06-24 LAB — COVID-19, FLU A+B NAA
Influenza A, NAA: NOT DETECTED
Influenza B, NAA: NOT DETECTED
SARS-CoV-2, NAA: NOT DETECTED

## 2020-10-08 ENCOUNTER — Other Ambulatory Visit: Payer: Self-pay

## 2020-10-08 ENCOUNTER — Emergency Department
Admission: EM | Admit: 2020-10-08 | Discharge: 2020-10-08 | Disposition: A | Discharge status: Home / Self Care | Payer: 59 | Source: Home / Self Care

## 2020-10-08 DIAGNOSIS — R059 Cough, unspecified: Secondary | ICD-10-CM

## 2020-10-08 DIAGNOSIS — R509 Fever, unspecified: Secondary | ICD-10-CM

## 2020-10-08 DIAGNOSIS — J309 Allergic rhinitis, unspecified: Secondary | ICD-10-CM

## 2020-10-08 DIAGNOSIS — B349 Viral infection, unspecified: Secondary | ICD-10-CM | POA: Diagnosis not present

## 2020-10-08 MED ORDER — METHYLPREDNISOLONE 4 MG PO TBPK: ORAL_TABLET | ORAL | 0 refills | Status: DC

## 2020-10-08 MED ORDER — ACETAMINOPHEN 325 MG PO TABS
650.0000 mg | ORAL_TABLET | Freq: Once | ORAL | Status: AC
  Administered 2020-10-08: 650 mg via ORAL

## 2020-10-08 MED ORDER — BENZONATATE 200 MG PO CAPS: 200.0000 mg | ORAL_CAPSULE | Freq: Three times a day (TID) | ORAL | 0 refills | Status: AC | PRN

## 2020-10-08 NOTE — ED Triage Notes (Addendum)
Pt c/o cough, congestion, fever and headache since Saturday. Taking Delsym with expectortant and tylenol prn. 100.8 fever yesterday. Neg covid at home test yesterday. Covid vaccinated + 1 booster.

## 2020-10-08 NOTE — Discharge Instructions (Addendum)
Advised/encouraged patient conservative measures for now alternate between OTC Tylenol 1000 mg 1-2 times daily, as needed with OTC Ibuprofen 800 mg 1-2 times daily, as needed.  Advised patient we will follow-up with COVID-19 results once received.  Advised patient to take medication as directed with food to completion.  Advised patient to take Allegra daily for the next 7 days, then as needed for concurrent postnasal drainage.

## 2020-10-08 NOTE — ED Provider Notes (Signed)
For Jason Brock CARE    CSN: [id number] Arrival date & time: 10/08/20  1002      History   Chief Complaint Chief Complaint  Patient presents with   Cough   Nasal Congestion   Sore Throat    HPI Jason Brock is a 55 y.o. male.   HPI 55 year old male presents with cough, congestion, fever and headache.  Patient reports 100.8 fever yesterday, negative COVID-19 home test yesterday.  Patient is vaccinated for COVID-19.  Past Medical History:  Diagnosis Date   Allergy    Asthma    Moderate ulcerative colitis ([place]) 2009   Dr. Earle Gell colonoscopy report    Patient Active Problem List   Diagnosis Date Noted   Obesity 03/27/2019   Allergic rhinitis 06/09/2016   Ganglion cyst 03/20/2016   Dyslipidemia 12/24/2014   Vitamin D deficiency 12/24/2014   Sleep apnea 12/22/2014   Asthma 12/22/2014   Dysphagia 12/22/2014    Past Surgical History:  Procedure Laterality Date   NASAL SEPTOPLASTY W/ TURBINOPLASTY         Home Medications    Prior to Admission medications   Medication Sig Start Date End Date Taking? Authorizing Provider  benzonatate (TESSALON) 200 MG capsule Take 1 capsule (200 mg total) by mouth 3 (three) times daily as needed for up to 7 days for cough. 10/08/20 10/15/20 Yes Eliezer Lofts, FNP  methylPREDNISolone (MEDROL DOSEPAK) 4 MG TBPK tablet Take as directed. 10/08/20  Yes Eliezer Lofts, FNP  acetaminophen (TYLENOL) 500 MG tablet Take 1,000 mg by mouth every 6 (six) hours as needed for moderate pain or headache.    [provider]  AMBULATORY NON FORMULARY MEDICATION Continuous positive airway pressure (CPAP) titrate setting per protcal of H2O pressure, with all supplemental supplies as needed. 01/15/15   Gregor Hams, MD  Ascorbic Acid (VITAMIN C) 1000 MG tablet Take 2,000 mg by mouth daily.    [provider]  atorvastatin (LIPITOR) 10 MG tablet TAKE 1 TABLET BY MOUTH EVERY DAY 05/19/20   Hilts, Legrand Como, MD  Cholecalciferol  (VITAMIN D3) 2000 units capsule Take 2,000 Units by mouth daily.    [provider]  fexofenadine (ALLEGRA ALLERGY) 180 MG tablet Take 1 tablet (180 mg total) by mouth daily for 15 days. 06/22/20 07/07/20  Eliezer Lofts, FNP  fluticasone (FLONASE) 50 MCG/ACT nasal spray Place into both nostrils daily.    [provider]  fluticasone furoate-vilanterol (BREO ELLIPTA) 200-25 MCG/INH AEPB Inhale 1 puff into the lungs daily. 05/22/18   Gregor Hams, MD  ibuprofen (ADVIL,MOTRIN) 200 MG tablet Take 400-800 mg by mouth every 6 (six) hours as needed (inflammation).    [provider]  levocetirizine (XYZAL) 5 MG tablet TAKE 1 TABLET (5 MG TOTAL) BY MOUTH EVERY EVENING. Patient taking differently: Take 5 mg by mouth daily.  12/13/15   Gregor Hams, MD  montelukast (SINGULAIR) 10 MG tablet Take 10 mg by mouth daily. 03/26/19   [provider]  Multiple Vitamins-Minerals (MULTIVITAMIN ADULT PO) Take 1 tablet by mouth daily.     [provider]  Omega-3 Fatty Acids (FISH OIL) 1200 MG CAPS Take 2,400 mg by mouth 2 (two) times daily.    [provider]  omeprazole (PRILOSEC) 40 MG capsule TAKE 1 CAPSULE (40 MG TOTAL) BY MOUTH DAILY. PLEASE SCHEDULE AN OFFICE VISIT WITH DR. Havery Moros FOR FURTHER REFILLS: [phone] 10/21/18   Armbruster, Carlota Raspberry, MD  predniSONE (DELTASONE) 20 MG tablet Take 3 tabs PO daily  x 5 days. 06/22/20   Eliezer Lofts, FNP  Ipratropium-Albuterol (COMBIVENT RESPIMAT) 20-100 MCG/ACT AERS respimat Inhale 1 puff into the lungs every 6 (six) hours. Patient not taking: Reported on 10/16/2019 05/10/18 03/09/20  Gregor Hams, MD    Family History Family History  Problem Relation Age of Onset   Cancer Mother    Heart disease Father    Depression Brother     Social History Social History   Tobacco Use   Smoking status: Never   Smokeless tobacco: Former    Types: Snuff    Quit date: 12/21/2004  Vaping Use   Vaping Use: Never used   Substance Use Topics   Alcohol use: Yes    Alcohol/week: 4.0 - 8.0 standard drinks    Types: 4 - 8 Standard drinks or equivalent per week    Comment: weekly   Drug use: No     Allergies   Dust mite extract, Bee venom, Pollen extract, and Peanut (diagnostic)   Review of Systems Review of Systems  Constitutional:  Positive for chills, fatigue and fever.  HENT:  Positive for postnasal drip.   Respiratory:  Positive for cough.   All other systems reviewed and are negative.   Physical Exam Triage Vital Signs ED Triage Vitals  Enc Vitals Group     BP 10/08/20 1032 138/90     Pulse Rate 10/08/20 1032 95     Resp 10/08/20 1032 17     Temp 10/08/20 1032 (!) 102.4 F (39.1 C)     Temp Source 10/08/20 1032 Oral     SpO2 10/08/20 1032 94 %     Weight --      Height --      Head Circumference --      Peak Flow --      Pain Score 10/08/20 1021 7     Pain Loc --      Pain Edu? --      Excl. in [place]? --    No data found.  Updated Vital Signs BP 138/90 (BP Location: Right Arm)   Pulse 95   Temp (!) 102.5 F (39.2 C)   Resp 17   SpO2 94%   Physical Exam Vitals and nursing note reviewed.  Constitutional:      General: He is not in acute distress.    Appearance: Normal appearance. He is normal weight. He is not ill-appearing.  HENT:     Head: Normocephalic and atraumatic.     Right Ear: Tympanic membrane, ear canal and external ear normal.     Left Ear: Tympanic membrane, ear canal and external ear normal.     Mouth/Throat:     Mouth: Mucous membranes are moist.     Pharynx: Oropharynx is clear.     Comments: Moderate amount of clear drainage of posterior oropharynx noted Eyes:     Extraocular Movements: Extraocular movements intact.     Conjunctiva/sclera: Conjunctivae normal.     Pupils: Pupils are equal, round, and reactive to light.  Cardiovascular:     Rate and Rhythm: Normal rate and regular rhythm.     Pulses: Normal pulses.     Heart sounds: Normal heart  sounds.  Pulmonary:     Effort: Pulmonary effort is normal.     Breath sounds: Normal breath sounds. No wheezing, rhonchi or rales.  Musculoskeletal:        General: Normal range of motion.     Cervical back: Normal range of motion and neck  supple.  Skin:    General: Skin is warm and dry.  Neurological:     General: No focal deficit present.     Mental Status: He is alert and oriented to person, place, and time. Mental status is at baseline.  Psychiatric:        Mood and Affect: Mood normal.        Behavior: Behavior normal.        Thought Content: Thought content normal.     UC Treatments / Results  Labs (all labs ordered are listed, but only abnormal results are displayed) Labs Reviewed  COVID-19, FLU A+B NAA    EKG   Radiology No results found.  Procedures Procedures (including critical care time)  Medications Ordered in UC Medications  acetaminophen (TYLENOL) tablet 650 mg (650 mg Oral Given 10/08/20 1124)    Initial Impression / Assessment and Plan / UC Course  I have reviewed the triage vital signs and the nursing notes.  Pertinent labs & imaging results that were available during my care of the patient were reviewed by me and considered in my medical decision making (see chart for details).     MDM: 1. Fever-COVID-19 PCR flu A&B ordered, Advised/encouraged patient conservative measures for now alternate between OTC Tylenol 1000 mg 1-2 times daily, as needed with OTC Ibuprofen 800 mg 1-2 times daily, as needed.  Advised patient we will follow-up with COVID-19 results once received, 2.  Viral illness-as above; 3. Cough-Medrol Dosepak and Tessalon Perles; 4.  Allergic rhinitis-patient is currently taking Allegra.  Patient discharged home, hemodynamically stable. Final Clinical Impressions(s) / UC Diagnoses   Final diagnoses:  Fever, unspecified  Viral syndrome  Cough  Allergic rhinitis, unspecified seasonality, unspecified trigger     Discharge  Instructions      Advised/encouraged patient conservative measures for now alternate between OTC Tylenol 1000 mg 1-2 times daily, as needed with OTC Ibuprofen 800 mg 1-2 times daily, as needed.  Advised patient we will follow-up with COVID-19 results once received.  Advised patient to take medication as directed with food to completion.  Advised patient to take Allegra daily for the next 7 days, then as needed for concurrent postnasal drainage.     ED Prescriptions     Medication Sig Dispense Auth. Provider   methylPREDNISolone (MEDROL DOSEPAK) 4 MG TBPK tablet Take as directed. 1 each Eliezer Lofts, FNP   benzonatate (TESSALON) 200 MG capsule Take 1 capsule (200 mg total) by mouth 3 (three) times daily as needed for up to 7 days for cough. 30 capsule Eliezer Lofts, FNP      PDMP not reviewed this encounter.   Bekim, [place], [place] 10/08/20 1205

## 2020-10-10 LAB — COVID-19, FLU A+B NAA
Influenza A, NAA: NOT DETECTED
Influenza B, NAA: NOT DETECTED
SARS-CoV-2, NAA: NOT DETECTED

## 2021-02-25 ENCOUNTER — Encounter: Payer: Self-pay | Admitting: Family Medicine

## 2021-02-25 ENCOUNTER — Other Ambulatory Visit: Payer: Self-pay

## 2021-02-25 ENCOUNTER — Ambulatory Visit: Payer: 59 | Admitting: Family Medicine

## 2021-02-25 VITALS — BP 133/80 | HR 71 | Temp 97.6°F | Ht 69.0 in | Wt 223.0 lb

## 2021-02-25 DIAGNOSIS — Z0000 Encounter for general adult medical examination without abnormal findings: Secondary | ICD-10-CM | POA: Diagnosis not present

## 2021-02-25 DIAGNOSIS — E785 Hyperlipidemia, unspecified: Secondary | ICD-10-CM | POA: Diagnosis not present

## 2021-02-25 DIAGNOSIS — Z125 Encounter for screening for malignant neoplasm of prostate: Secondary | ICD-10-CM

## 2021-02-25 DIAGNOSIS — R7303 Prediabetes: Secondary | ICD-10-CM | POA: Diagnosis not present

## 2021-02-25 DIAGNOSIS — Z23 Encounter for immunization: Secondary | ICD-10-CM

## 2021-02-25 NOTE — Patient Instructions (Signed)

## 2021-02-26 LAB — COMPLETE METABOLIC PANEL WITH GFR
AG Ratio: 1.7 (calc) (ref 1.0–2.5)
ALT: 22 U/L (ref 9–46)
AST: 20 U/L (ref 10–35)
Albumin: 4.3 g/dL (ref 3.6–5.1)
Alkaline phosphatase (APISO): 47 U/L (ref 35–144)
BUN: 16 mg/dL (ref 7–25)
CO2: 26 mmol/L (ref 20–32)
Calcium: 9.8 mg/dL (ref 8.6–10.3)
Chloride: 104 mmol/L (ref 98–110)
Creat: 1.08 mg/dL (ref 0.70–1.30)
Globulin: 2.5 g/dL (calc) (ref 1.9–3.7)
Glucose, Bld: 103 mg/dL — ABNORMAL HIGH (ref 65–99)
Potassium: 4.8 mmol/L (ref 3.5–5.3)
Sodium: 139 mmol/L (ref 135–146)
Total Bilirubin: 0.7 mg/dL (ref 0.2–1.2)
Total Protein: 6.8 g/dL (ref 6.1–8.1)
eGFR: 82 mL/min/{1.73_m2} (ref >=60)

## 2021-02-26 LAB — LIPID PANEL W/REFLEX DIRECT LDL
Cholesterol: 166 mg/dL (ref <200)
HDL: 32 mg/dL — ABNORMAL LOW (ref >=40)
LDL Cholesterol (Calc): 107 mg/dL (calc) — ABNORMAL HIGH
Non-HDL Cholesterol (Calc): 134 mg/dL (calc) — ABNORMAL HIGH (ref <130)
Total CHOL/HDL Ratio: 5.2 (calc) — ABNORMAL HIGH (ref <5.0)
Triglycerides: 155 mg/dL — ABNORMAL HIGH (ref <150)

## 2021-02-26 LAB — CBC WITH DIFFERENTIAL/PLATELET
Absolute Monocytes: 499 cells/uL (ref 200–950)
Basophils Absolute: 32 cells/uL (ref 0–200)
Basophils Relative: 0.5 %
Eosinophils Absolute: 102 cells/uL (ref 15–500)
Eosinophils Relative: 1.6 %
HCT: 45.2 % (ref 38.5–50.0)
Hemoglobin: 15.3 g/dL (ref 13.2–17.1)
Lymphs Abs: 2080 cells/uL (ref 850–3900)
MCH: 29.4 pg (ref 27.0–33.0)
MCHC: 33.8 g/dL (ref 32.0–36.0)
MCV: 86.9 fL (ref 80.0–100.0)
MPV: 11.9 fL (ref 7.5–12.5)
Monocytes Relative: 7.8 %
Neutro Abs: 3686 cells/uL (ref 1500–7800)
Neutrophils Relative %: 57.6 %
Platelets: 275 10*3/uL (ref 140–400)
RBC: 5.2 10*6/uL (ref 4.20–5.80)
RDW: 12.7 % (ref 11.0–15.0)
Total Lymphocyte: 32.5 %
WBC: 6.4 10*3/uL (ref 3.8–10.8)

## 2021-02-26 LAB — PSA: PSA: 0.65 ng/mL (ref <=4.00)

## 2021-02-26 LAB — HEMOGLOBIN A1C
Hgb A1c MFr Bld: 5.3 % of total Hgb (ref <5.7)
Mean Plasma Glucose: 105 mg/dL
eAG (mmol/L): 5.8 mmol/L

## 2021-02-27 DIAGNOSIS — Z0000 Encounter for general adult medical examination without abnormal findings: Secondary | ICD-10-CM | POA: Insufficient documentation

## 2021-02-27 NOTE — Progress Notes (Signed)
Dinero Chavira - 56 y.o. male MRN [id number]  Date of birth: Jan 19, 1965  Subjective Chief Complaint  Patient presents with   Establish Care    HPI Demaris is a 56 year old male here today to reestablish care.  He has not been seen in our clinic in several years.  He has history of asthma and allergies, GERD and hyperlipidemia.  Reports he is doing well with current medications.  He does see an allergist for management of his asthma.  He would like to go ahead and have annual physical today.  He is fasting for labs.  He does stay fairly active.  Feels like his diet is pretty healthy.  He has had colon cancer screening.  He is a non-smoker.  He consumes alcohol occasionally.  Review of Systems  Constitutional:  Negative for chills, fever, malaise/fatigue and weight loss.  HENT:  Negative for congestion, ear pain and sore throat.   Eyes:  Negative for blurred vision, double vision and pain.  Respiratory:  Negative for cough and shortness of breath.   Cardiovascular:  Negative for chest pain and palpitations.  Gastrointestinal:  Negative for abdominal pain, blood in stool, constipation, heartburn and nausea.  Genitourinary:  Negative for dysuria and urgency.  Musculoskeletal:  Negative for joint pain and myalgias.  Neurological:  Negative for dizziness and headaches.  Endo/Heme/Allergies:  Does not bruise/bleed easily.  Psychiatric/Behavioral:  Negative for depression. The patient is not nervous/anxious and does not have insomnia.    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 Earle Gell colonoscopy report   Sleep apnea 01/17/2015    Past Surgical History:  Procedure Laterality Date   NASAL  SEPTOPLASTY W/ TURBINOPLASTY      Social History   Socioeconomic History   Marital status: Married    Spouse name: Not on file   Number of children: 1   Years of education: Not on file   Highest education level: Not on file  Occupational History   Occupation: Engineer, structural  Tobacco Use   Smoking status: Never    Passive exposure: Never   Smokeless tobacco: Former    Types: Snuff    Quit date: 12/21/2004  Vaping Use   Vaping Use: Never used  Substance and Sexual Activity   Alcohol use: Yes    Alcohol/week: 5.0 standard drinks    Types: 5 Standard drinks or equivalent per week    Comment: weekly   Drug use: No   Sexual activity: Yes    Partners: Female  Other Topics Concern   Not on file  Social History Narrative   Not on file   Social Determinants of Health   Financial Resource Strain: Not on file  Food Insecurity: Not on file  Transportation Needs: Not on file  Physical Activity: Not on file  Stress: Not on file  Social Connections: Not on file    Family History  Problem Relation Age of Onset   Cancer Mother    COPD Mother    Breast cancer Mother    Heart disease Father    Heart attack Father    Depression Brother    Depression Brother  Health Maintenance  Topic Date Due   Hepatitis C Screening  Never done   COVID-19 Vaccine (4 - Booster for Pfizer series) 03/13/2021 (Originally 12/20/2019)   Zoster Vaccines- Shingrix (2 of 2) 04/22/2021   COLONOSCOPY (Pts 45-65yr Insurance coverage will need to be confirmed)  06/22/2022   TETANUS/TDAP  03/21/2026   INFLUENZA VACCINE  Completed   HIV Screening  Completed   HPV VACCINES  Aged Out     ----------------------------------------------------------------------------------------------------------------------------------------------------------------------------------------------------------------- Physical Exam BP 133/80 (BP Location: Left Arm, Patient Position: Sitting, Cuff Size: Normal)    Pulse 71     Temp 97.6 F (36.4 C)    Ht 5' 9"  (1.753 m)    Wt 223 lb (101.2 kg)    SpO2 98%    BMI 32.93 kg/m   Physical Exam Constitutional:      General: He is not in acute distress. HENT:     Head: Normocephalic and atraumatic.     Right Ear: Tympanic membrane and external ear normal.     Left Ear: Tympanic membrane and external ear normal.  Eyes:     General: No scleral icterus. Neck:     Thyroid: No thyromegaly.  Cardiovascular:     Rate and Rhythm: Normal rate and regular rhythm.     Heart sounds: Normal heart sounds.  Pulmonary:     Effort: Pulmonary effort is normal.     Breath sounds: Normal breath sounds.  Abdominal:     General: Bowel sounds are normal. There is no distension.     Palpations: Abdomen is soft.     Tenderness: There is no abdominal tenderness. There is no guarding.  Musculoskeletal:     Cervical back: Normal range of motion.  Lymphadenopathy:     Cervical: No cervical adenopathy.  Skin:    General: Skin is warm and dry.     Findings: No rash.  Neurological:     Mental Status: He is alert and oriented to person, place, and time.     Cranial Nerves: No cranial nerve deficit.     Motor: No abnormal muscle tone.  Psychiatric:        Mood and Affect: Mood normal.        Behavior: Behavior normal.    ------------------------------------------------------------------------------------------------------------------------------------------------------------------------------------------------------------------- Assessment and Plan  Well adult exam Well adult Orders Placed This Encounter  Procedures   Varicella-zoster vaccine IM (Shingrix)   COMPLETE METABOLIC PANEL WITH GFR   CBC with Differential   Lipid Panel w/reflex Direct LDL   PSA   HgB A1c  Screenings: Per lab orders Immunizations: Shingrix No. 1 given today Anticipatory guidance/risk factor reduction: Recommendations per AVS.   No orders of the defined types were placed in this  encounter.   No follow-ups on file.    This visit occurred during the SARS-CoV-2 public health emergency.  Safety protocols were in place, including screening questions prior to the visit, additional usage of staff PPE, and extensive cleaning of exam room while observing appropriate contact time as indicated for disinfecting solutions.

## 2021-02-27 NOTE — Assessment & Plan Note (Signed)
Well adult Orders Placed This Encounter  Procedures   Varicella-zoster vaccine IM (Shingrix)   COMPLETE METABOLIC PANEL WITH GFR   CBC with Differential   Lipid Panel w/reflex Direct LDL   PSA   HgB A1c  Screenings: Per lab orders Immunizations: Shingrix No. 1 given today Anticipatory guidance/risk factor reduction: Recommendations per AVS.

## 2021-03-04 ENCOUNTER — Encounter: Payer: Self-pay | Admitting: Family Medicine

## 2021-03-07 MED ORDER — ATORVASTATIN CALCIUM 10 MG PO TABS: 10.0000 mg | ORAL_TABLET | Freq: Every day | ORAL | 2 refills | Status: DC

## 2021-05-25 ENCOUNTER — Ambulatory Visit: Payer: 59

## 2021-05-27 ENCOUNTER — Ambulatory Visit: Payer: 59

## 2021-06-03 ENCOUNTER — Ambulatory Visit (INDEPENDENT_AMBULATORY_CARE_PROVIDER_SITE_OTHER): Payer: 59 | Admitting: Family Medicine

## 2021-06-03 VITALS — Temp 98.6°F

## 2021-06-03 DIAGNOSIS — Z23 Encounter for immunization: Secondary | ICD-10-CM

## 2021-06-03 NOTE — Progress Notes (Signed)
Pt is here for 2nd zostavax. Pt tolerated well.

## 2021-06-03 NOTE — Progress Notes (Signed)
Correction to nurse note- here for Shingrix #2.  Medical screening examination/treatment was performed by qualified clinical staff member and as supervising physician I was immediately available for consultation/collaboration. I have reviewed documentation and agree with assessment and plan.  Luetta Nutting, DO

## 2021-08-16 ENCOUNTER — Encounter: Payer: Self-pay | Admitting: Family Medicine

## 2021-08-16 ENCOUNTER — Ambulatory Visit: Payer: 59 | Admitting: Family Medicine

## 2021-08-16 VITALS — BP 124/71 | HR 60 | Ht 69.0 in | Wt 205.0 lb

## 2021-08-16 DIAGNOSIS — K219 Gastro-esophageal reflux disease without esophagitis: Secondary | ICD-10-CM | POA: Diagnosis not present

## 2021-08-16 DIAGNOSIS — R0789 Other chest pain: Secondary | ICD-10-CM | POA: Diagnosis not present

## 2021-08-16 NOTE — Addendum Note (Signed)
Addended by: Perlie Mayo on: 08/16/2021 02:41 PM   Modules accepted: Orders

## 2021-08-16 NOTE — Progress Notes (Signed)
Jason Brock - 56 y.o. male MRN [id number]  Date of birth: [birth year]  Subjective Chief Complaint  Patient presents with   Gastroesophageal Reflux    HPI Merdith Brock is a 56 y.o. Jason Brock here today with complaint of reflux.  He has had issues with GERD/heartburn and takes omeprazole at 3m daily.  This has controlled this until recently.  He is having pain located in the center of his chest.  Pain does not radiate.  Denies shortness of breath.  Father had MI in his 531's  He did have some other GI issues last week including some loose stools.  This is not uncommon for him.  Denies blood in stool or dark stools.  He had been following a low carb diet and had a carb heavy meal over the weekend so isn't sure if this may be contributing.    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 MEarle Gellcolonoscopy report   Sleep apnea 01/17/2015    Past Surgical History:  Procedure Laterality Date   NASAL SEPTOPLASTY W/ TURBINOPLASTY      Social History   Socioeconomic History   Marital status: Married    Spouse name: Not on file   Number of children: 1   Years of education: Not on file   Highest education level: Not on file  Occupational History   Occupation: PEngineer, structural Tobacco Use   Smoking status: Never    Passive exposure: Never   Smokeless tobacco: Former    Types: Snuff    Quit date: 12/21/2004  Vaping Use   Vaping Use: Never used  Substance and Sexual Activity   Alcohol use: Yes    Alcohol/week: 5.0 standard drinks of alcohol    Types: 5 Standard drinks or equivalent per week    Comment: weekly   Drug use:  No   Sexual activity: Yes    Partners: Female  Other Topics Concern   Not on file  Social History Narrative   Not on file   Social Determinants of Health   Financial Resource Strain: Not on file  Food Insecurity: Not on file  Transportation Needs: Not on file  Physical Activity: Not on file  Stress: Not on file  Social Connections: Not on file    Family History  Problem Relation Age of Onset   Cancer Mother    COPD Mother    Breast cancer Mother    Heart disease Father    Heart attack Father    Depression Brother    Depression Brother     Health Maintenance  Topic Date Due   Hepatitis C Screening  Never done   INFLUENZA VACCINE  08/16/2021   COVID-19 Vaccine (4 - [place]) 11/16/2021 (Originally 12/20/2019)   COLONOSCOPY (Pts 45-465yrInsurance coverage will need to be confirmed)  06/22/2022   TETANUS/TDAP  03/21/2026   HIV Screening  Completed   Zoster Vaccines- Shingrix  Completed   HPV VACCINES  Aged Out     ----------------------------------------------------------------------------------------------------------------------------------------------------------------------------------------------------------------- Physical Exam BP 124/71 (BP Location: Left Arm, Patient Position: Sitting, Cuff  Size: Normal)   Pulse 60   Ht 5' 9"  (1.753 m)   Wt 205 lb (93 kg)   SpO2 98%   BMI 30.27 kg/m   Physical Exam Constitutional:      Appearance: Normal appearance.  Eyes:     General: No scleral icterus. Cardiovascular:     Rate and Rhythm: Normal rate and regular rhythm.  Pulmonary:     Effort: Pulmonary effort is normal.     Breath sounds: Normal breath sounds.  Musculoskeletal:     Cervical back: Neck supple.  Neurological:     General: No focal deficit present.     Mental Status: He is alert.  Psychiatric:        Mood and Affect: Mood normal.        Behavior: Behavior normal.    EKG: Sinus bradycardia with 1st degree AV block.  No previous to  compare to.  ------------------------------------------------------------------------------------------------------------------------------------------------------------------------------------------------------------------- Assessment and Plan  Atypical chest pain Likely related to GERD.  EKG without ischemic changes.  Recommend increase of omeprazole to 61m BID.  May use maalox for breakthrough symptoms.  Call for worsening symptoms or if not improving after 1-2 weeks.     No orders of the defined types were placed in this encounter.   No follow-ups on file.    This visit occurred during the SARS-CoV-2 public health emergency.  Safety protocols were in place, including screening questions prior to the visit, additional usage of staff PPE, and extensive cleaning of exam room while observing appropriate contact time as indicated for disinfecting solutions.

## 2021-08-16 NOTE — Patient Instructions (Signed)
Let's try increasing omeprazole to 49m twice per day or 458monce per day.   Let me know if symptoms are not improving after a week or two or if symptoms get worse.

## 2021-08-16 NOTE — Assessment & Plan Note (Signed)
Likely related to GERD.  EKG without ischemic changes.  Recommend increase of omeprazole to 16m BID.  May use maalox for breakthrough symptoms.  Call for worsening symptoms or if not improving after 1-2 weeks.

## 2022-01-14 ENCOUNTER — Other Ambulatory Visit: Payer: Self-pay | Admitting: Family Medicine

## 2022-02-17 ENCOUNTER — Ambulatory Visit (INDEPENDENT_AMBULATORY_CARE_PROVIDER_SITE_OTHER): Payer: 59

## 2022-02-17 ENCOUNTER — Ambulatory Visit
Admission: EM | Admit: 2022-02-17 | Discharge: 2022-02-17 | Disposition: A | Discharge status: Home / Self Care | Payer: 59 | Attending: Family Medicine | Admitting: Family Medicine

## 2022-02-17 ENCOUNTER — Telehealth: Payer: Self-pay

## 2022-02-17 DIAGNOSIS — J4521 Mild intermittent asthma with (acute) exacerbation: Secondary | ICD-10-CM

## 2022-02-17 DIAGNOSIS — R059 Cough, unspecified: Secondary | ICD-10-CM

## 2022-02-17 MED ORDER — PREDNISONE 20 MG PO TABS: ORAL_TABLET | ORAL | 0 refills | Status: DC

## 2022-02-17 MED ORDER — AZITHROMYCIN 250 MG PO TABS: ORAL_TABLET | ORAL | 0 refills | Status: DC

## 2022-02-17 MED ORDER — METHYLPREDNISOLONE SODIUM SUCC 125 MG IJ SOLR
80.0000 mg | Freq: Once | INTRAMUSCULAR | Status: AC
  Administered 2022-02-17: 80 mg via INTRAMUSCULAR

## 2022-02-17 NOTE — Telephone Encounter (Signed)
Called patient to schedule e-visit. Patient stated he would go to Urgent Care instead.

## 2022-02-17 NOTE — ED Triage Notes (Signed)
Pt c/o cough and congestion x 2 weeks. Denies fever. States he woke up this am with diarrhea. Tested neg at home this morning for COVID. Taking delsym and kaopectate prn.

## 2022-02-17 NOTE — ED Provider Notes (Signed)
[facility]    CSN: [id number] Arrival date & time: 02/17/22  1529      History   Chief Complaint Chief Complaint  Patient presents with   Cough   Nasal Congestion   Diarrhea    HPI Jason Brock is a 57 y.o. male.   Patient developed a URI two weeks ago, and complains of persistent chest congestion and cough. He denies fever, pleuritic pain, and shortness of breath.  He awoke this morning with diarrhea, so checked a home COVID test that was negative.  He has asthma generally controlled with daily Breo.  The history is provided by the patient.    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 Earle Gell colonoscopy report   Sleep apnea 01/17/2015    Patient Active Problem List   Diagnosis Date Noted   Atypical chest pain 08/16/2021   Well adult exam 02/27/2021   Obesity 03/27/2019   Allergic rhinitis 06/09/2016   Ganglion cyst 03/20/2016   Dyslipidemia 12/24/2014   Vitamin D deficiency 12/24/2014   Sleep apnea 12/22/2014   Asthma 12/22/2014   Dysphagia 12/22/2014    Past Surgical History:  Procedure Laterality Date   NASAL SEPTOPLASTY W/ TURBINOPLASTY         Home Medications    Prior to Admission medications   Medication Sig Start Date End Date Taking? Authorizing Provider  azithromycin (ZITHROMAX Z-PAK) 250 MG tablet Take 2 tabs today; then begin one tab once daily for 4 more days. 02/17/22  Yes Kandra Nicolas, MD  predniSONE (DELTASONE) 20 MG tablet Take one tab by mouth twice daily for 4 days, then one daily for 3 days. Take with food. 02/17/22  Yes Kandra Nicolas, MD  AMBULATORY NON FORMULARY MEDICATION Continuous positive airway pressure (CPAP) titrate setting per protcal of H2O pressure, with all supplemental supplies as needed. 01/15/15   Gregor Hams, MD  Ascorbic Acid (VITAMIN C) 1000 MG tablet Take 2,000 mg by mouth daily.     [provider]  atorvastatin (LIPITOR) 10 MG tablet TAKE 1 TABLET BY MOUTH EVERY DAY 01/18/22   Luetta Nutting, DO  azelastine (ASTELIN) 0.1 % nasal spray SMARTSIG:1-2 Spray(s) Both Nares Twice Daily PRN Patient not taking: Reported on 08/16/2021 01/25/21   [provider]  Cholecalciferol (VITAMIN D3) 2000 units capsule Take 2,000 Units by mouth daily.    [provider]  fluticasone (FLONASE) 50 MCG/ACT nasal spray Place into both nostrils daily. Patient not taking: Reported on 08/16/2021    [provider]  fluticasone furoate-vilanterol (BREO ELLIPTA) 200-25 MCG/INH AEPB Inhale 1 puff into the lungs daily. 05/22/18   Gregor Hams, MD  levocetirizine (XYZAL) 5 MG tablet TAKE 1 TABLET (5 MG TOTAL) BY MOUTH EVERY EVENING. Patient taking differently: Take 5 mg by mouth daily. 12/13/15   Gregor Hams, MD  montelukast (SINGULAIR) 10 MG tablet Take 10 mg by mouth daily. Patient not taking: Reported on 08/16/2021 03/26/19   [provider]  Multiple Vitamins-Minerals (MULTIVITAMIN ADULT PO) Take 1 tablet by mouth daily.     [provider]  Omega-3 Fatty Acids (FISH OIL) 1200 MG CAPS Take 2,400 mg by mouth 2 (two) times daily.    [provider]  omeprazole (PRILOSEC) 20 MG capsule Take 20 mg by mouth daily.    [provider]  Ipratropium-Albuterol (COMBIVENT RESPIMAT) 20-100 MCG/ACT AERS respimat Inhale 1 puff into the lungs every 6 (six) hours. Patient not taking: Reported on 10/16/2019 05/10/18 03/09/20  Gregor Hams, MD    Family History Family History  Problem Relation Age of Onset   Cancer Mother    COPD Mother    Breast cancer Mother    Heart disease Father    Heart attack Father    Depression Brother    Depression Brother     Social History Social History   Tobacco Use   Smoking status: Never    Passive exposure: Never   Smokeless tobacco: Former    Types: Snuff    Quit date: 12/21/2004  Vaping Use   Vaping Use:  Never used  Substance Use Topics   Alcohol use: Yes    Alcohol/week: 5.0 standard drinks of alcohol    Types: 5 Standard drinks or equivalent per week    Comment: weekly   Drug use: No     Allergies   Dust mite extract, Bee venom, Peanut (diagnostic), and Pollen extract   Review of Systems Review of Systems No sore throat + cough No pleuritic pain + wheezing + nasal congestion + post-nasal drainage No sinus pain/pressure No itchy/red eyes No earache No hemoptysis No SOB No fever/chills No nausea No vomiting No abdominal pain + diarrhea No urinary symptoms No skin rash + fatigue No myalgias No headache Used OTC meds (Delsym) without relief   Physical Exam Triage Vital Signs ED Triage Vitals  Enc Vitals Group     BP 02/17/22 1537 (!) 137/90     Pulse Rate 02/17/22 1537 94     Resp 02/17/22 1537 16     Temp 02/17/22 1537 (!) 96 F (35.6 C)     Temp Source 02/17/22 1537 Oral     SpO2 02/17/22 1537 96 %     Weight --      Height --      Head Circumference --      Peak Flow --      Pain Score 02/17/22 1538 0     Pain Loc --      Pain Edu? --      Excl. in [place]? --    No data found.  Updated Vital Signs BP (!) 137/90 (BP Location: Right Arm)   Pulse 94   Temp (!) 96 F (35.6 C) (Oral)   Resp 16   SpO2 96%   Visual Acuity Right Eye Distance:   Left Eye Distance:   Bilateral Distance:    Right Eye Near:   Left Eye Near:    Bilateral Near:     Physical Exam Nursing notes and Vital Signs reviewed. Appearance:  Patient appears stated age, and in no acute distress Eyes:  Pupils are equal, round, and reactive to light and accomodation.  Extraocular movement is intact.  Conjunctivae are not inflamed  Ears:  Canals normal.  Tympanic membranes normal.  Nose:  Mildly congested turbinates.  No sinus tenderness.  Pharynx:  Normal Neck:  Supple.  No adenopathy.  Lungs:  Diffuse wheezes bilaterally.  Breath sounds are equal.  Moving air well. Heart:   Regular rate and rhythm without murmurs, rubs, or gallops.  Abdomen:  Nontender without masses or hepatosplenomegaly.  Bowel sounds are present.  No CVA or flank tenderness.  Extremities:  No edema.  Skin:  No rash present.   UC Treatments / Results  Labs (all labs ordered are listed, but only abnormal  results are displayed) Labs Reviewed - No data to display  EKG   Radiology DG Chest 2 View  Result Date: 02/17/2022 CLINICAL DATA:  Persistent cough. EXAM: CHEST - 2 VIEW COMPARISON:  March 20, 2018. FINDINGS: The heart size and mediastinal contours are within normal limits. Both lungs are clear. The visualized skeletal structures are unremarkable. IMPRESSION: No active cardiopulmonary disease. Electronically Signed   By: Marijo Conception M.D.   On: 02/17/2022 16:24    Procedures Procedures (including critical care time)  Medications Ordered in UC Medications  methylPREDNISolone sodium succinate (SOLU-MEDROL) 125 mg/2 mL injection 80 mg (has no administration in time range)    Initial Impression / Assessment and Plan / UC Course  I have reviewed the triage vital signs and the nursing notes.  Pertinent labs & imaging results that were available during my care of the patient were reviewed by me and considered in my medical decision making (see chart for details).    Administered Solumedrol '80mg'$  IM.  Begin prednisone burst/taper and Z-pack. Followup with Family Doctor if not improved in one week.   Final Clinical Impressions(s) / UC Diagnoses   Final diagnoses:  Mild intermittent asthmatic bronchitis with acute exacerbation     Discharge Instructions      Begin prednisone Saturday 02/18/22. Take plain guaifenesin ('1200mg'$  extended release tabs such as Mucinex) twice daily, with plenty of water, for cough and congestion.  Get adequate rest.   May take Delsym Cough Suppressant ("12 Hour Cough Relief") at bedtime for nighttime cough.  Stop all antihistamines for now, and other  non-prescription cough/cold preparations.  If symptoms become significantly worse during the night or over the weekend, proceed to the local emergency room.     ED Prescriptions     Medication Sig Dispense Auth. Provider   predniSONE (DELTASONE) 20 MG tablet Take one tab by mouth twice daily for 4 days, then one daily for 3 days. Take with food. 11 tablet Kandra Nicolas, MD   azithromycin (ZITHROMAX Z-PAK) 250 MG tablet Take 2 tabs today; then begin one tab once daily for 4 more days. 6 tablet Kandra Nicolas, MD         Kandra Nicolas, MD 02/19/22 [phone]

## 2022-02-17 NOTE — Discharge Instructions (Signed)
Begin prednisone Saturday 02/18/22. Take plain guaifenesin ('1200mg'$  extended release tabs such as Mucinex) twice daily, with plenty of water, for cough and congestion.  Get adequate rest.   May take Delsym Cough Suppressant ("12 Hour Cough Relief") at bedtime for nighttime cough.  Stop all antihistamines for now, and other non-prescription cough/cold preparations.  If symptoms become significantly worse during the night or over the weekend, proceed to the local emergency room.

## 2022-02-17 NOTE — Telephone Encounter (Signed)
Diarrhea, cough, uri. Pt would like to schedule e-visit. Please call patient to schedule.

## 2022-05-23 ENCOUNTER — Ambulatory Visit: Payer: 59 | Admitting: Family Medicine

## 2022-05-23 ENCOUNTER — Encounter: Payer: Self-pay | Admitting: Family Medicine

## 2022-05-23 VITALS — BP 134/85 | HR 79 | Ht 69.0 in | Wt 227.0 lb

## 2022-05-23 DIAGNOSIS — H6590 Unspecified nonsuppurative otitis media, unspecified ear: Secondary | ICD-10-CM | POA: Insufficient documentation

## 2022-05-23 DIAGNOSIS — H6502 Acute serous otitis media, left ear: Secondary | ICD-10-CM

## 2022-05-23 MED ORDER — PREDNISONE 50 MG PO TABS: ORAL_TABLET | ORAL | 0 refills | Status: DC

## 2022-05-23 NOTE — Progress Notes (Signed)
Jason Brock - 57 y.o. male MRN [id number]  Date of birth: [birth year]  Subjective Chief Complaint  Patient presents with   Ear Fullness   Jaw Pain    HPI Jason Brock is a 57 y.o. male here today with complaint of ear pain.  Having pain and reports pressure in the left ear and left jaw area.  Symptoms began a few days ago.  It does bother him to chew.  He has not had any fever or chills.  He denies headache.  No drainage from the ear.  He does have issues with allergies.  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 Danise Edge colonoscopy report   Sleep apnea 01/17/2015    Past Surgical History:  Procedure Laterality Date   NASAL SEPTOPLASTY W/ TURBINOPLASTY      Social History   Socioeconomic History   Marital status: Married    Spouse name: Not on file   Number of children: 1   Years of education: Not on file   Highest education level: Bachelor's degree (e.g., BA, AB, BS)  Occupational History   Occupation: Emergency planning/management officer  Tobacco Use   Smoking status: Never    Passive exposure: Never   Smokeless tobacco: Former    Types: Snuff    Quit date: 12/21/2004  Vaping Use   Vaping Use: Never used  Substance and Sexual Activity   Alcohol use: Yes    Alcohol/week: 5.0 standard drinks of alcohol    Types: 5 Standard drinks or equivalent per week    Comment: weekly   Drug use: No   Sexual activity: Yes    Partners: Female  Other Topics Concern   Not on file  Social History Narrative   Not on file   Social Determinants of Health   Financial Resource Strain: Low Risk  (05/22/2022)   Overall Financial Resource  Strain (CARDIA)    Difficulty of Paying Living Expenses: Not hard at all  Food Insecurity: No Food Insecurity (05/22/2022)   Hunger Vital Sign    Worried About Running Out of Food in the Last Year: Never true    Ran Out of Food in the Last Year: Never true  Transportation Needs: No Transportation Needs (05/22/2022)   PRAPARE - Administrator, Civil Service (Medical): No    Lack of Transportation (Non-Medical): No  Physical Activity: Insufficiently Active (05/22/2022)   Exercise Vital Sign    Days of Exercise per Week: 2 days    Minutes of Exercise per Session: 30 min  Stress: No Stress Concern Present (05/22/2022)   [employer] of Occupational Health - Occupational Stress Questionnaire    Feeling of Stress : Not at all  Social Connections: Moderately Isolated (05/22/2022)   Social Connection and Isolation Panel [NHANES]    Frequency of Communication with Friends and Family: Twice a week    Frequency of Social Gatherings with Friends and Family: More than three times a week    Attends Religious Services: Never    Database administrator or Organizations: No    Attends  Banker Meetings: Not on file    Marital Status: Married    Family History  Problem Relation Age of Onset   Cancer Mother    COPD Mother    Breast cancer Mother    Heart disease Father    Heart attack Father    Depression Brother    Depression Brother     Health Maintenance  Topic Date Due   Hepatitis C Screening  Never done   COVID-19 Vaccine (4 - 2023-24 season) 09/16/2021   COLONOSCOPY (Pts 45-62yrs Insurance coverage will need to be confirmed)  06/22/2022   INFLUENZA VACCINE  08/17/2022   DTaP/Tdap/Td (3 - Td or Tdap) 03/21/2026   HIV Screening  Completed   Zoster Vaccines- Shingrix  Completed   HPV VACCINES  Aged Out      ----------------------------------------------------------------------------------------------------------------------------------------------------------------------------------------------------------------- Physical Exam BP 134/85 (BP Location: Left Arm, Patient Position: Sitting, Cuff Size: Normal)   Pulse 79   Ht 5\' 9"  (1.753 m)   Wt 227 lb (103 kg)   SpO2 97%   BMI 33.52 kg/m   Physical Exam Constitutional:      Appearance: Normal appearance.  HENT:     Head: Normocephalic and atraumatic.     Right Ear: Tympanic membrane normal.     Ears:     Comments: Left serous middle ear effusion.  Tenderness palpation along the left TMJ. Eyes:     General: No scleral icterus. Musculoskeletal:     Cervical back: Neck supple.  Neurological:     Mental Status: He is alert.     ------------------------------------------------------------------------------------------------------------------------------------------------------------------------------------------------------------------- Assessment and Plan  Serous otitis media He does have serous middle ear effusion.  Encouraged to continue antihistamine as well as Flonase.  Adding burst of prednisone.  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   Meds ordered this encounter  Medications   predniSONE (DELTASONE) 50 MG tablet    Sig: Take 50mg  daily x5 days.    Dispense:  5 tablet    Refill:  0    No follow-ups on file.    This visit occurred during the SARS-CoV-2 public health emergency.  Safety protocols were in place, including screening questions prior to the visit, additional usage of staff PPE, and extensive cleaning of exam room while observing appropriate contact time as indicated for disinfecting solutions.

## 2022-05-23 NOTE — Patient Instructions (Addendum)
Start prednisone Add flonase daily Massage voltaren gel into jaw area 3-4 times per day.  Let me know if worsening or not improving.

## 2022-05-23 NOTE — Assessment & Plan Note (Signed)
He does have serous middle ear effusion.  Encouraged to continue antihistamine as well as Flonase.  Adding burst of prednisone.  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

## 2022-06-19 ENCOUNTER — Encounter: Payer: Self-pay | Admitting: Gastroenterology

## 2022-08-08 ENCOUNTER — Ambulatory Visit (INDEPENDENT_AMBULATORY_CARE_PROVIDER_SITE_OTHER): Payer: 59 | Admitting: Family Medicine

## 2022-08-08 ENCOUNTER — Encounter: Payer: Self-pay | Admitting: Family Medicine

## 2022-08-08 VITALS — BP 123/83 | HR 75 | Ht 69.0 in | Wt 233.0 lb

## 2022-08-08 DIAGNOSIS — Z0000 Encounter for general adult medical examination without abnormal findings: Secondary | ICD-10-CM

## 2022-08-08 DIAGNOSIS — Z6833 Body mass index (BMI) 33.0-33.9, adult: Secondary | ICD-10-CM

## 2022-08-08 DIAGNOSIS — Z125 Encounter for screening for malignant neoplasm of prostate: Secondary | ICD-10-CM | POA: Diagnosis not present

## 2022-08-08 DIAGNOSIS — E6609 Other obesity due to excess calories: Secondary | ICD-10-CM

## 2022-08-08 DIAGNOSIS — E559 Vitamin D deficiency, unspecified: Secondary | ICD-10-CM | POA: Diagnosis not present

## 2022-08-08 DIAGNOSIS — E785 Hyperlipidemia, unspecified: Secondary | ICD-10-CM

## 2022-08-08 NOTE — Assessment & Plan Note (Signed)
Well adult Orders Placed This Encounter  Procedures   [id number]+EGFR    Order Specific Question:   Remote health to draw?    Answer:   Yes   CBC with Differential    Order Specific Question:   Remote health to draw?    Answer:   Yes   TSH    Order Specific Question:   Remote health to draw?    Answer:   Yes   Vitamin D (25 hydroxy)    Order Specific Question:   Remote health to draw?    Answer:   Yes   Lipid Panel With LDL/HDL Ratio    Order Specific Question:   Remote health to draw?    Answer:   Yes   PSA    Order Specific Question:   Remote health to draw?    Answer:   Yes  Immunizations: Up-to-date Screenings per lab orders Anticipatory guidance/risk factor reduction: Recommendations per AVS.

## 2022-08-08 NOTE — Patient Instructions (Signed)

## 2022-08-08 NOTE — Assessment & Plan Note (Signed)
Encouraged dietary changes as well as incorporation of regular exercise to help with weight management.

## 2022-08-08 NOTE — Progress Notes (Signed)
Jason Brock - 57 y.o. male MRN [id number]  Date of birth: 12-08-1965  Subjective Chief Complaint  Patient presents with   Annual Exam    HPI Jason Brock is a 57 y.o. male here today for annual exam.   He reports that he is doing OK.  He is concerned about weight gain.   He has not been quite as active as he typically is.  He has started to incorporate some of this back and slowly.  He admits that his diet has not been very good.  He may possibly be interested in seeing a dietitian  He is a non-smoker.  Intermittent alcohol use, nothing in excess.  Doing well with current medications.  Review of Systems  Constitutional:  Negative for chills, fever, malaise/fatigue and weight loss.  HENT:  Negative for congestion, ear pain and sore throat.   Eyes:  Negative for blurred vision, double vision and pain.  Respiratory:  Negative for cough and shortness of breath.   Cardiovascular:  Negative for chest pain and palpitations.  Gastrointestinal:  Negative for abdominal pain, blood in stool, constipation, heartburn and nausea.  Genitourinary:  Negative for dysuria and urgency.  Musculoskeletal:  Negative for joint pain and myalgias.  Neurological:  Negative for dizziness and headaches.  Endo/Heme/Allergies:  Does not bruise/bleed easily.  Psychiatric/Behavioral:  Negative for depression. The patient is not nervous/anxious and does not have insomnia.     Allergies  Allergen Reactions   Dust Mite Extract Shortness Of Breath   Bee Venom Swelling   Peanut (Diagnostic) Itching and Rash   Pollen Extract Other (See Comments)    Past Medical History:  Diagnosis Date   Allergy    Arthritis December 2018   Right hip   Asthma    Moderate ulcerative colitis (HCC) 2009   Dr. Danise Edge colonoscopy report   Sleep apnea 01/17/2015    Past Surgical History:  Procedure Laterality Date   NASAL SEPTOPLASTY W/ TURBINOPLASTY      Social History   Socioeconomic History   Marital  status: Married    Spouse name: Not on file   Number of children: 1   Years of education: Not on file   Highest education level: Bachelor's degree (e.g., BA, AB, BS)  Occupational History   Occupation: Emergency planning/management officer  Tobacco Use   Smoking status: Never    Passive exposure: Never   Smokeless tobacco: Former    Types: Snuff    Quit date: 12/21/2004  Vaping Use   Vaping status: Never Used  Substance and Sexual Activity   Alcohol use: Yes    Alcohol/week: 5.0 standard drinks of alcohol    Types: 5 Standard drinks or equivalent per week    Comment: weekly   Drug use: No   Sexual activity: Yes    Partners: Female  Other Topics Concern   Not on file  Social History Narrative   Not on file   Social Determinants of Health   Financial Resource Strain: Low Risk  (05/22/2022)   Overall Financial Resource Strain (CARDIA)    Difficulty of Paying Living Expenses: Not hard at all  Food Insecurity: No Food Insecurity (05/22/2022)   Hunger Vital Sign    Worried About Running Out of Food in the Last Year: Never true    Ran Out of Food in the Last Year: Never true  Transportation Needs: No Transportation Needs (05/22/2022)   PRAPARE - Transportation    Lack of Transportation (Medical): No    Lack  of Transportation (Non-Medical): No  Physical Activity: Insufficiently Active (05/22/2022)   Exercise Vital Sign    Days of Exercise per Week: 2 days    Minutes of Exercise per Session: 30 min  Stress: No Stress Concern Present (05/22/2022)   [employer] of Occupational Health - Occupational Stress Questionnaire    Feeling of Stress : Not at all  Social Connections: Moderately Isolated (05/22/2022)   Social Connection and Isolation Panel [NHANES]    Frequency of Communication with Friends and Family: Twice a week    Frequency of Social Gatherings with Friends and Family: More than three times a week    Attends Religious Services: Never    Database administrator or Organizations: No    Attends  Engineer, structural: Not on file    Marital Status: Married    Family History  Problem Relation Age of Onset   Cancer Mother    COPD Mother    Breast cancer Mother    Heart disease Father    Heart attack Father    Depression Brother    Depression Brother     Health Maintenance  Topic Date Due   Hepatitis C Screening  Never done   COVID-19 Vaccine (4 - 2023-24 season) 09/16/2021   Colonoscopy  06/22/2022   INFLUENZA VACCINE  08/17/2022   DTaP/Tdap/Td (3 - Td or Tdap) 03/21/2026   HIV Screening  Completed   Zoster Vaccines- Shingrix  Completed   HPV VACCINES  Aged Out     ----------------------------------------------------------------------------------------------------------------------------------------------------------------------------------------------------------------- Physical Exam BP 123/83 (BP Location: Left Arm, Patient Position: Sitting, Cuff Size: Large)   Pulse 75   Ht 5\' 9"  (1.753 m)   Wt 233 lb (105.7 kg)   SpO2 98%   BMI 34.41 kg/m   Physical Exam Constitutional:      General: He is not in acute distress. HENT:     Head: Normocephalic and atraumatic.     Right Ear: Tympanic membrane and external ear normal.     Left Ear: Tympanic membrane and external ear normal.  Eyes:     General: No scleral icterus. Neck:     Thyroid: No thyromegaly.  Cardiovascular:     Rate and Rhythm: Normal rate and regular rhythm.     Heart sounds: Normal heart sounds.  Pulmonary:     Effort: Pulmonary effort is normal.     Breath sounds: Normal breath sounds.  Abdominal:     General: Bowel sounds are normal. There is no distension.     Palpations: Abdomen is soft.     Tenderness: There is no abdominal tenderness. There is no guarding.  Musculoskeletal:     Cervical back: Normal range of motion.  Lymphadenopathy:     Cervical: No cervical adenopathy.  Skin:    General: Skin is warm and dry.     Findings: No rash.  Neurological:     Mental Status: He  is alert and oriented to person, place, and time.     Cranial Nerves: No cranial nerve deficit.     Motor: No abnormal muscle tone.  Psychiatric:        Mood and Affect: Mood normal.        Behavior: Behavior normal.     ------------------------------------------------------------------------------------------------------------------------------------------------------------------------------------------------------------------- Assessment and Plan  Well adult exam Well adult Orders Placed This Encounter  Procedures   CMP14+EGFR    Order Specific Question:   Remote health to draw?    Answer:   Yes   CBC with Differential  Order Specific Question:   Remote health to draw?    Answer:   Yes   TSH    Order Specific Question:   Remote health to draw?    Answer:   Yes   Vitamin D (25 hydroxy)    Order Specific Question:   Remote health to draw?    Answer:   Yes   Lipid Panel With LDL/HDL Ratio    Order Specific Question:   Remote health to draw?    Answer:   Yes   PSA    Order Specific Question:   Remote health to draw?    Answer:   Yes  Immunizations: Up-to-date Screenings per lab orders Anticipatory guidance/risk factor reduction: Recommendations per AVS.  Obesity Encouraged dietary changes as well as incorporation of regular exercise to help with weight management.   No orders of the defined types were placed in this encounter.   No follow-ups on file.    This visit occurred during the SARS-CoV-2 public health emergency.  Safety protocols were in place, including screening questions prior to the visit, additional usage of staff PPE, and extensive cleaning of exam room while observing appropriate contact time as indicated for disinfecting solutions.

## 2022-08-09 LAB — CBC WITH DIFFERENTIAL/PLATELET
Basophils Absolute: 0 10*3/uL (ref 0.0–0.2)
Basos: 0 %
EOS (ABSOLUTE): 0.2 10*3/uL (ref 0.0–0.4)
Eos: 2 %
Hematocrit: 45 % (ref 37.5–51.0)
Hemoglobin: 15.2 g/dL (ref 13.0–17.7)
Immature Grans (Abs): 0.1 10*3/uL (ref 0.0–0.1)
Immature Granulocytes: 1 %
Lymphocytes Absolute: 2.6 10*3/uL (ref 0.7–3.1)
Lymphs: 32 %
MCH: 29.7 pg (ref 26.6–33.0)
MCHC: 33.8 g/dL (ref 31.5–35.7)
MCV: 88 fL (ref 79–97)
Monocytes Absolute: 0.7 10*3/uL (ref 0.1–0.9)
Monocytes: 8 %
Neutrophils Absolute: 4.7 10*3/uL (ref 1.4–7.0)
Neutrophils: 57 %
Platelets: 306 10*3/uL (ref 150–450)
RBC: 5.11 x10E6/uL (ref 4.14–5.80)
RDW: 13 % (ref 11.6–15.4)
WBC: 8.3 10*3/uL (ref 3.4–10.8)

## 2022-08-09 LAB — CMP14+EGFR
ALT: 38 IU/L (ref 0–44)
AST: 31 IU/L (ref 0–40)
Albumin: 4.4 g/dL (ref 3.8–4.9)
Alkaline Phosphatase: 64 IU/L (ref 44–121)
BUN/Creatinine Ratio: 17 (ref 9–20)
BUN: 17 mg/dL (ref 6–24)
Bilirubin Total: 0.6 mg/dL (ref 0.0–1.2)
CO2: 20 mmol/L (ref 20–29)
Calcium: 9.6 mg/dL (ref 8.7–10.2)
Chloride: 100 mmol/L (ref 96–106)
Creatinine, Ser: 0.98 mg/dL (ref 0.76–1.27)
Globulin, Total: 2.6 g/dL (ref 1.5–4.5)
Glucose: 102 mg/dL — ABNORMAL HIGH (ref 70–99)
Potassium: 4.4 mmol/L (ref 3.5–5.2)
Sodium: 136 mmol/L (ref 134–144)
Total Protein: 7 g/dL (ref 6.0–8.5)
eGFR: 91 mL/min/{1.73_m2} (ref >59)

## 2022-08-09 LAB — VITAMIN D 25 HYDROXY (VIT D DEFICIENCY, FRACTURES): Vit D, 25-Hydroxy: 39.1 ng/mL (ref 30.0–100.0)

## 2022-08-09 LAB — LIPID PANEL WITH LDL/HDL RATIO
Cholesterol, Total: 211 mg/dL — ABNORMAL HIGH (ref 100–199)
HDL: 32 mg/dL — ABNORMAL LOW (ref >39)
LDL Chol Calc (NIH): 84 mg/dL (ref 0–99)
LDL/HDL Ratio: 2.6 ratio (ref 0.0–3.6)
Triglycerides: 584 mg/dL (ref 0–149)
VLDL Cholesterol Cal: 95 mg/dL — ABNORMAL HIGH (ref 5–40)

## 2022-08-09 LAB — PSA: Prostate Specific Ag, Serum: 0.8 ng/mL (ref 0.0–4.0)

## 2022-08-09 LAB — TSH: TSH: 2.89 u[IU]/mL (ref 0.450–4.500)

## 2022-08-11 ENCOUNTER — Encounter: Payer: Self-pay | Admitting: Family Medicine

## 2022-08-14 NOTE — Telephone Encounter (Signed)
Called [facility] and attempted to add A1C - they will let us know if this test is unable to be ran for any reason.

## 2022-08-15 ENCOUNTER — Other Ambulatory Visit: Payer: Self-pay | Admitting: Family Medicine

## 2022-08-15 MED ORDER — ICOSAPENT ETHYL 1 G PO CAPS: 2.0000 g | ORAL_CAPSULE | Freq: Two times a day (BID) | ORAL | 3 refills | Status: DC

## 2022-08-16 ENCOUNTER — Other Ambulatory Visit: Payer: Self-pay | Admitting: Family Medicine

## 2022-08-16 NOTE — Telephone Encounter (Signed)
PA needed for vascepa

## 2022-08-17 ENCOUNTER — Telehealth: Payer: Self-pay

## 2022-08-17 NOTE — Telephone Encounter (Addendum)
Initiated Prior authorization [id number] Ethyl Esters 1GM capsules Via: Covermymeds Case/Key:[id number] Status: Pending as of 08/17/22 Reason: Notified Pt via: Mychart

## 2022-08-18 MED ORDER — OMEGA-3-ACID ETHYL ESTERS 1 G PO CAPS: 2.0000 g | ORAL_CAPSULE | Freq: Two times a day (BID) | ORAL | 1 refills | Status: DC

## 2022-08-18 NOTE — Telephone Encounter (Signed)
Insurance requesting change to [place]. Updated rx sent in.  CM

## 2023-01-31 ENCOUNTER — Other Ambulatory Visit: Payer: Self-pay | Admitting: Family Medicine

## 2023-01-31 NOTE — Telephone Encounter (Signed)
 Requesting rx rf of  Atorvastatin  10mg  -last written 01/18/2022 Last lipid panel=08/08/2022 ( trg= [id number])  Last OV 08/08/2022 Upcoming visit = none

## 2023-08-03 ENCOUNTER — Other Ambulatory Visit: Payer: Self-pay | Admitting: Medical Genetics

## 2023-08-09 ENCOUNTER — Encounter: Payer: Self-pay | Admitting: Family Medicine

## 2023-08-09 ENCOUNTER — Ambulatory Visit (INDEPENDENT_AMBULATORY_CARE_PROVIDER_SITE_OTHER): Admitting: Family Medicine

## 2023-08-09 VITALS — BP 119/79 | HR 62 | Ht 69.0 in | Wt 221.0 lb

## 2023-08-09 DIAGNOSIS — E66811 Obesity, class 1: Secondary | ICD-10-CM

## 2023-08-09 DIAGNOSIS — Z0000 Encounter for general adult medical examination without abnormal findings: Secondary | ICD-10-CM | POA: Diagnosis not present

## 2023-08-09 DIAGNOSIS — Z125 Encounter for screening for malignant neoplasm of prostate: Secondary | ICD-10-CM

## 2023-08-09 DIAGNOSIS — E6609 Other obesity due to excess calories: Secondary | ICD-10-CM

## 2023-08-09 DIAGNOSIS — E559 Vitamin D deficiency, unspecified: Secondary | ICD-10-CM | POA: Diagnosis not present

## 2023-08-09 DIAGNOSIS — E785 Hyperlipidemia, unspecified: Secondary | ICD-10-CM | POA: Diagnosis not present

## 2023-08-09 DIAGNOSIS — Z6833 Body mass index (BMI) 33.0-33.9, adult: Secondary | ICD-10-CM

## 2023-08-09 NOTE — Patient Instructions (Signed)

## 2023-08-09 NOTE — Progress Notes (Signed)
 Jason Brock - 58 y.o. male MRN [id number]  Date of birth: March 21, 1965  Subjective Chief Complaint  Patient presents with   Annual Exam    HPI Jason Brock is a 58 y.o. male here today for annual exam.   He reports that he is doing pretty well.    He remains moderately active and feels that diet is pretty good.   He is a non-smoker.  He does consume EtOH occasionally.   He has regular dental care.   Review of Systems  Constitutional:  Negative for chills, fever, malaise/fatigue and weight loss.  HENT:  Negative for congestion, ear pain and sore throat.   Eyes:  Negative for blurred vision, double vision and pain.  Respiratory:  Negative for cough and shortness of breath.   Cardiovascular:  Negative for chest pain and palpitations.  Gastrointestinal:  Negative for abdominal pain, blood in stool, constipation, heartburn and nausea.  Genitourinary:  Negative for dysuria and urgency.  Musculoskeletal:  Negative for joint pain and myalgias.  Neurological:  Negative for dizziness and headaches.  Endo/Heme/Allergies:  Does not bruise/bleed easily.  Psychiatric/Behavioral:  Negative for depression. The patient is not nervous/anxious and does not have insomnia.     Allergies  Allergen Reactions   Dust Mite Extract Shortness Of Breath   Bee Venom Swelling   Peanut (Diagnostic) Itching and Rash   Pollen Extract Other (See Comments)    Past Medical History:  Diagnosis Date   Allergy    Arthritis December 2018   Right hip   Asthma    Moderate ulcerative colitis (HCC) 2009   Dr. Gladis Louder colonoscopy report   Sleep apnea 01/17/2015    Past Surgical History:  Procedure Laterality Date   NASAL SEPTOPLASTY W/ TURBINOPLASTY      Social History   Socioeconomic History   Marital status: Married    Spouse name: Not on file   Number of children: 1   Years of education: Not on file   Highest education level: Bachelor's degree (e.g., BA, AB, BS)  Occupational History    Occupation: Emergency planning/management officer  Tobacco Use   Smoking status: Never    Passive exposure: Never   Smokeless tobacco: Former    Types: Snuff    Quit date: 12/21/2004  Vaping Use   Vaping status: Never Used  Substance and Sexual Activity   Alcohol use: Yes    Alcohol/week: 5.0 standard drinks of alcohol    Types: 5 Standard drinks or equivalent per week    Comment: weekly   Drug use: No   Sexual activity: Yes    Partners: Female  Other Topics Concern   Not on file  Social History Narrative   Not on file   Social Drivers of Health   Financial Resource Strain: Low Risk  (08/05/2023)   Overall Financial Resource Strain (CARDIA)    Difficulty of Paying Living Expenses: Not hard at all  Food Insecurity: No Food Insecurity (08/05/2023)   Hunger Vital Sign    Worried About Running Out of Food in the Last Year: Never true    Ran Out of Food in the Last Year: Never true  Transportation Needs: No Transportation Needs (08/05/2023)   PRAPARE - Administrator, Civil Service (Medical): No    Lack of Transportation (Non-Medical): No  Physical Activity: Sufficiently Active (08/05/2023)   Exercise Vital Sign    Days of Exercise per Week: 3 days    Minutes of Exercise per Session: 60 min  Stress: No Stress Concern Present (08/05/2023)   [employer] of Occupational Health - Occupational Stress Questionnaire    Feeling of Stress: Not at all  Social Connections: Moderately Integrated (08/05/2023)   Social Connection and Isolation Panel    Frequency of Communication with Friends and Family: Twice a week    Frequency of Social Gatherings with Friends and Family: Once a week    Attends Religious Services: 1 to 4 times per year    Active Member of [employer] or Organizations: No    Attends Engineer, structural: Not on file    Marital Status: Married    Family History  Problem Relation Age of Onset   Cancer Mother    COPD Mother    Breast cancer Mother    Heart disease Father     Heart attack Father    Depression Brother    Depression Brother     Health Maintenance  Topic Date Due   Hepatitis C Screening  Never done   Hepatitis B Vaccines (1 of 3 - 19+ 3-dose series) Never done   Pneumococcal Vaccine 51-67 Years old (1 of 2 - PCV) 08/08/2024 (Originally 11/17/1985)   COVID-19 Vaccine (4 - 2024-25 season) 08/24/2024 (Originally 09/17/2022)   INFLUENZA VACCINE  08/17/2023   Colonoscopy  06/21/2024   DTaP/Tdap/Td (3 - Td or Tdap) 03/21/2026   HIV Screening  Completed   Zoster Vaccines- Shingrix   Completed   HPV VACCINES  Aged Out   Meningococcal B Vaccine  Aged Out     ----------------------------------------------------------------------------------------------------------------------------------------------------------------------------------------------------------------- Physical Exam BP 119/79 (BP Location: Left Arm, Patient Position: Sitting, Cuff Size: Normal)   Pulse 62   Ht 5' 9 (1.753 m)   Wt 221 lb (100.2 kg)   SpO2 100%   BMI 32.64 kg/m   Physical Exam Constitutional:      General: He is not in acute distress. HENT:     Head: Normocephalic and atraumatic.     Right Ear: Tympanic membrane and external ear normal.     Left Ear: Tympanic membrane and external ear normal.  Eyes:     General: No scleral icterus. Neck:     Thyroid: No thyromegaly.  Cardiovascular:     Rate and Rhythm: Normal rate and regular rhythm.     Heart sounds: Normal heart sounds.  Pulmonary:     Effort: Pulmonary effort is normal.     Breath sounds: Normal breath sounds.  Abdominal:     General: Bowel sounds are normal. There is no distension.     Palpations: Abdomen is soft.     Tenderness: There is no abdominal tenderness. There is no guarding.  Musculoskeletal:     Cervical back: Normal range of motion.  Lymphadenopathy:     Cervical: No cervical adenopathy.  Skin:    General: Skin is warm and dry.     Findings: No rash.  Neurological:     Mental  Status: He is alert and oriented to person, place, and time.     Cranial Nerves: No cranial nerve deficit.     Motor: No abnormal muscle tone.  Psychiatric:        Mood and Affect: Mood normal.        Behavior: Behavior normal.     ------------------------------------------------------------------------------------------------------------------------------------------------------------------------------------------------------------------- Assessment and Plan  Well adult exam Well adult Orders Placed This Encounter  Procedures   CMP14+EGFR   CBC with Differential/Platelet   Lipid Panel With LDL/HDL Ratio   PSA   TSH   Testosterone  Vitamin D  (25 hydroxy)  Immunizations: Up-to-date Screenings per lab orders Anticipatory guidance/risk factor reduction: Recommendations per AVS.   No orders of the defined types were placed in this encounter.   No follow-ups on file.

## 2023-08-09 NOTE — Addendum Note (Signed)
 Addended by: FRANCHOT ARTA PEDLAR on: 08/09/2023 03:26 PM   Modules accepted: Orders

## 2023-08-09 NOTE — Assessment & Plan Note (Signed)
 Well adult Orders Placed This Encounter  Procedures   CMP14+EGFR   CBC with Differential/Platelet   Lipid Panel With LDL/HDL Ratio   PSA   TSH   Testosterone   Vitamin D  (25 hydroxy)  Immunizations: Up-to-date Screenings per lab orders Anticipatory guidance/risk factor reduction: Recommendations per AVS.

## 2023-08-10 LAB — CBC WITH DIFFERENTIAL/PLATELET
Basophils Absolute: 0 x10E3/uL (ref 0.0–0.2)
Basos: 0 %
EOS (ABSOLUTE): 0.1 x10E3/uL (ref 0.0–0.4)
Eos: 1 %
Hematocrit: 45.6 % (ref 37.5–51.0)
Hemoglobin: 15 g/dL (ref 13.0–17.7)
Immature Grans (Abs): 0.1 x10E3/uL (ref 0.0–0.1)
Immature Granulocytes: 1 %
Lymphocytes Absolute: 2.5 x10E3/uL (ref 0.7–3.1)
Lymphs: 34 %
MCH: 29.8 pg (ref 26.6–33.0)
MCHC: 32.9 g/dL (ref 31.5–35.7)
MCV: 91 fL (ref 79–97)
Monocytes Absolute: 0.5 x10E3/uL (ref 0.1–0.9)
Monocytes: 7 %
Neutrophils Absolute: 4.3 x10E3/uL (ref 1.4–7.0)
Neutrophils: 57 %
Platelets: 296 x10E3/uL (ref 150–450)
RBC: 5.03 x10E6/uL (ref 4.14–5.80)
RDW: 13.2 % (ref 11.6–15.4)
WBC: 7.5 x10E3/uL (ref 3.4–10.8)

## 2023-08-10 LAB — TSH: TSH: 2.97 u[IU]/mL (ref 0.450–4.500)

## 2023-08-10 LAB — CMP14+EGFR
ALT: 22 IU/L (ref 0–44)
AST: 24 IU/L (ref 0–40)
Albumin: 4.4 g/dL (ref 3.8–4.9)
Alkaline Phosphatase: 64 IU/L (ref 44–121)
BUN/Creatinine Ratio: 14 (ref 9–20)
BUN: 14 mg/dL (ref 6–24)
Bilirubin Total: 0.5 mg/dL (ref 0.0–1.2)
CO2: 20 mmol/L (ref 20–29)
Calcium: 8.7 mg/dL (ref 8.7–10.2)
Chloride: 106 mmol/L (ref 96–106)
Creatinine, Ser: 0.99 mg/dL (ref 0.76–1.27)
Globulin, Total: 2.3 g/dL (ref 1.5–4.5)
Glucose: 102 mg/dL — ABNORMAL HIGH (ref 70–99)
Potassium: 4.3 mmol/L (ref 3.5–5.2)
Sodium: 143 mmol/L (ref 134–144)
Total Protein: 6.7 g/dL (ref 6.0–8.5)
eGFR: 89 mL/min/1.73 (ref >59)

## 2023-08-10 LAB — LIPID PANEL WITH LDL/HDL RATIO
Cholesterol, Total: 165 mg/dL (ref 100–199)
HDL: 31 mg/dL — ABNORMAL LOW (ref >39)
LDL Chol Calc (NIH): 80 mg/dL (ref 0–99)
LDL/HDL Ratio: 2.6 ratio (ref 0.0–3.6)
Triglycerides: 334 mg/dL — ABNORMAL HIGH (ref 0–149)
VLDL Cholesterol Cal: 54 mg/dL — ABNORMAL HIGH (ref 5–40)

## 2023-08-10 LAB — TESTOSTERONE: Testosterone: 447 ng/dL (ref 264–916)

## 2023-08-10 LAB — VITAMIN D 25 HYDROXY (VIT D DEFICIENCY, FRACTURES): Vit D, 25-Hydroxy: 46 ng/mL (ref 30.0–100.0)

## 2023-08-10 LAB — PSA: Prostate Specific Ag, Serum: 0.8 ng/mL (ref 0.0–4.0)

## 2023-08-16 ENCOUNTER — Ambulatory Visit: Payer: Self-pay | Admitting: Family Medicine

## 2023-08-16 MED ORDER — ATORVASTATIN CALCIUM 10 MG PO TABS: 10.0000 mg | ORAL_TABLET | Freq: Every day | ORAL | 1 refills | Status: DC

## 2023-08-16 NOTE — Telephone Encounter (Signed)
 Patient states that he will just await for testing next year to check A1C - states that he does not feel that this has likely changed much from last testing.   He  is requesting rx rf of atorvastatin  as 90 / d supply be sent to [facility]  Old prescription still had 3 months of refills remaining - changed to [facility] and resent as 90 day supply.

## 2023-08-16 NOTE — Progress Notes (Signed)
 Hi Jason Brock, metabolic panel looks good.  Your triglycerides are are high.  Though they are better than they were a year ago so that is wonderful.  I know you take omega-3 which is great that can definitely help and you also take atorvastatin .  Would you be comfortable bumping up your atorvastatin  to 20 mg to try to get a little bit better control, in addition to continuing to work on healthy diet and regular exercise.  Your vitamin D  looks better and is adequate.  Blood count and prostate test are normal.  Thyroid level looks great.  Testosterone  is also adequate.

## 2024-01-13 ENCOUNTER — Other Ambulatory Visit: Payer: Self-pay | Admitting: Family Medicine
